# Patient Record
Sex: Male | Born: 1965
Health system: Southern US, Community
[De-identification: ages and names within clinical notes are randomized; demographics above are authoritative.]

## PROBLEM LIST (undated history)

## (undated) DIAGNOSIS — I639 Cerebral infarction, unspecified: Secondary | ICD-10-CM

## (undated) DIAGNOSIS — R569 Unspecified convulsions: Secondary | ICD-10-CM

## (undated) DIAGNOSIS — E119 Type 2 diabetes mellitus without complications: Secondary | ICD-10-CM

## (undated) HISTORY — PX: CORONARY ARTERY BYPASS GRAFT: SHX141

---

## 2020-04-23 ENCOUNTER — Emergency Department (HOSPITAL_COMMUNITY): Payer: Self-pay

## 2020-04-23 ENCOUNTER — Emergency Department (HOSPITAL_COMMUNITY)
Admission: EM | Admit: 2020-04-23 | Discharge: 2020-04-24 | Discharge status: Against Medical Advice | Payer: Self-pay | Attending: Emergency Medicine | Admitting: Emergency Medicine

## 2020-04-23 DIAGNOSIS — R202 Paresthesia of skin: Secondary | ICD-10-CM | POA: Insufficient documentation

## 2020-04-23 DIAGNOSIS — I639 Cerebral infarction, unspecified: Secondary | ICD-10-CM | POA: Insufficient documentation

## 2020-04-23 DIAGNOSIS — R531 Weakness: Secondary | ICD-10-CM

## 2020-04-23 DIAGNOSIS — R2990 Unspecified symptoms and signs involving the nervous system: Secondary | ICD-10-CM

## 2020-04-23 DIAGNOSIS — E119 Type 2 diabetes mellitus without complications: Secondary | ICD-10-CM | POA: Insufficient documentation

## 2020-04-23 LAB — COMPREHENSIVE METABOLIC PANEL
ALT: 12 U/L (ref 0–44)
AST: 16 U/L (ref 15–41)
Albumin: 3.6 g/dL (ref 3.5–5.0)
Alkaline Phosphatase: 72 U/L (ref 38–126)
Anion gap: 11 (ref 5–15)
BUN: 18 mg/dL (ref 6–20)
CO2: 20 mmol/L — ABNORMAL LOW (ref 22–32)
Calcium: 9.2 mg/dL (ref 8.9–10.3)
Chloride: 109 mmol/L (ref 98–111)
Creatinine, Ser: 0.99 mg/dL (ref 0.61–1.24)
GFR calc Af Amer: >60 mL/min (ref >60)
GFR calc non Af Amer: >60 mL/min (ref >60)
Glucose, Bld: 98 mg/dL (ref 70–99)
Potassium: 3.8 mmol/L (ref 3.5–5.1)
Sodium: 140 mmol/L (ref 135–145)
Total Bilirubin: 1.3 mg/dL — ABNORMAL HIGH (ref 0.3–1.2)
Total Protein: 6.8 g/dL (ref 6.5–8.1)

## 2020-04-23 LAB — DIFFERENTIAL
Abs Immature Granulocytes: 0.02 10*3/uL (ref 0.00–0.07)
Basophils Absolute: 0.1 10*3/uL (ref 0.0–0.1)
Basophils Relative: 1 %
Eosinophils Absolute: 0.1 10*3/uL (ref 0.0–0.5)
Eosinophils Relative: 1 %
Immature Granulocytes: 0 %
Lymphocytes Relative: 29 %
Lymphs Abs: 2.7 10*3/uL (ref 0.7–4.0)
Monocytes Absolute: 0.8 10*3/uL (ref 0.1–1.0)
Monocytes Relative: 9 %
Neutro Abs: 5.5 10*3/uL (ref 1.7–7.7)
Neutrophils Relative %: 60 %

## 2020-04-23 LAB — CBC
HCT: 43.7 % (ref 39.0–52.0)
Hemoglobin: 14.5 g/dL (ref 13.0–17.0)
MCH: 30.1 pg (ref 26.0–34.0)
MCHC: 33.2 g/dL (ref 30.0–36.0)
MCV: 90.9 fL (ref 80.0–100.0)
Platelets: 238 10*3/uL (ref 150–400)
RBC: 4.81 MIL/uL (ref 4.22–5.81)
RDW: 13.8 % (ref 11.5–15.5)
WBC: 9.3 10*3/uL (ref 4.0–10.5)
nRBC: 0 % (ref 0.0–0.2)

## 2020-04-23 LAB — I-STAT CHEM 8, ED
BUN: 23 mg/dL — ABNORMAL HIGH (ref 6–20)
Calcium, Ion: 1.17 mmol/L (ref 1.15–1.40)
Chloride: 106 mmol/L (ref 98–111)
Creatinine, Ser: 1 mg/dL (ref 0.61–1.24)
Glucose, Bld: 98 mg/dL (ref 70–99)
HCT: 44 % (ref 39.0–52.0)
Hemoglobin: 15 g/dL (ref 13.0–17.0)
Potassium: 3.7 mmol/L (ref 3.5–5.1)
Sodium: 142 mmol/L (ref 135–145)
TCO2: 19 mmol/L — ABNORMAL LOW (ref 22–32)

## 2020-04-23 LAB — APTT: aPTT: 28 seconds (ref 24–36)

## 2020-04-23 LAB — PROTIME-INR
INR: 1 (ref 0.8–1.2)
Prothrombin Time: 13.2 seconds (ref 11.4–15.2)

## 2020-04-23 LAB — CBG MONITORING, ED: Glucose-Capillary: 82 mg/dL (ref 70–99)

## 2020-04-23 LAB — ETHANOL: Alcohol, Ethyl (B): <10 mg/dL (ref <10)

## 2020-04-23 MED ORDER — LORAZEPAM 1 MG PO TABS
1.0000 mg | ORAL_TABLET | Freq: Once | ORAL | Status: AC
  Administered 2020-04-24: 1 mg via ORAL
  Filled 2020-04-23: qty 1

## 2020-04-23 NOTE — ED Provider Notes (Signed)
[facility] EMERGENCY DEPARTMENT Provider Note   CSN: [id number] Arrival date & time: 04/23/20  2209     History No chief complaint on file.   Artis Beggs is a 54 y.o. male.  Patient brought in by EMS as a code stroke.  Patient last normal sometimes last evening.  Has left facial weakness left upper extremity left lower extremity weakness.  Patient being seen by code stroke team as well as me.  Patient states he moved here 4 days ago.  Has a [place] driver's license.  EMS was called for him having problems.  Patient states that he has had history of multiple strokes in the past this is happened many times.  Past medical history seems to be significant for diabetes.        No past medical history on file.  There are no problems to display for this patient.        No family history on file.  Social History   Tobacco Use  . Smoking status: Not on file  Substance Use Topics  . Alcohol use: Not on file  . Drug use: Not on file    Home Medications Prior to Admission medications   Not on File    Allergies    Patient has no allergy information on record.  Review of Systems   Review of Systems  Constitutional: Negative for chills and fever.  HENT: Negative for congestion, rhinorrhea and sore throat.   Eyes: Negative for visual disturbance.  Respiratory: Negative for cough and shortness of breath.   Cardiovascular: Negative for chest pain and leg swelling.  Gastrointestinal: Negative for abdominal pain, diarrhea, nausea and vomiting.  Genitourinary: Negative for dysuria.  Musculoskeletal: Negative for back pain and neck pain.  Skin: Negative for rash.  Neurological: Positive for weakness and numbness. Negative for dizziness, light-headedness and headaches.  Hematological: Does not bruise/bleed easily.  Psychiatric/Behavioral: Negative for confusion.    Physical Exam Updated Vital Signs BP (!) 132/77 (BP Location: Right Arm)   Pulse 78    Resp 22   SpO2 100%   Physical Exam Vitals and nursing note reviewed.  Constitutional:      Appearance: He is well-developed.  HENT:     Head: Normocephalic and atraumatic.  Eyes:     Conjunctiva/sclera: Conjunctivae normal.     Comments: Patient with difficulty opening left eye.  He rolls the left eye back.  Cardiovascular:     Rate and Rhythm: Normal rate and regular rhythm.     Heart sounds: No murmur heard.   Pulmonary:     Effort: Pulmonary effort is normal. No respiratory distress.     Breath sounds: Normal breath sounds.  Abdominal:     Palpations: Abdomen is soft.     Tenderness: There is no abdominal tenderness.  Musculoskeletal:        General: No swelling.     Cervical back: Neck supple.  Skin:    General: Skin is warm and dry.  Neurological:     Mental Status: He is alert and oriented to person, place, and time.     Cranial Nerves: Cranial nerve deficit present.     Sensory: Sensory deficit present.     Motor: Weakness present.     Comments: Patient appears to have left facial weakness.  Left upper extremity weakness.  And left lower extremity weakness.  But also he does seem to be tightening his muscles in his thigh.  Questioning whether there is true weakness  or not.     ED Results / Procedures / Treatments   Labs (all labs ordered are listed, but only abnormal results are displayed) Labs Reviewed  ETHANOL  PROTIME-INR  APTT  CBC  DIFFERENTIAL  COMPREHENSIVE METABOLIC PANEL  RAPID URINE DRUG SCREEN, HOSP PERFORMED  URINALYSIS, ROUTINE W REFLEX MICROSCOPIC  I-STAT CHEM 8, ED    EKG None  Radiology No results found.  Procedures Procedures (including critical care time)  CRITICAL CARE Performed by: Vanetta Mulders Total critical care time: 45 minutes Critical care time was exclusive of separately billable procedures and treating other patients. Critical care was necessary to treat or prevent imminent or life-threatening  deterioration. Critical care was time spent personally by me on the following activities: development of treatment plan with patient and/or surrogate as well as nursing, discussions with consultants, evaluation of patient's response to treatment, examination of patient, obtaining history from patient or surrogate, ordering and performing treatments and interventions, ordering and review of laboratory studies, ordering and review of radiographic studies, pulse oximetry and re-evaluation of patient's condition.   Medications Ordered in ED Medications - No data to display  ED Course  I have reviewed the triage vital signs and the nursing notes.  Pertinent labs & imaging results that were available during my care of the patient were reviewed by me and considered in my medical decision making (see chart for details).    MDM Rules/Calculators/A&P                          Patient taken immediately to CT scanner for the head.  The left-sided weakness is questionable whether it is true or not or somewhat dependent on his effort.   Code stroke order set done.  Both my self and to the neuro hospitalist feel that this may be psychogenic in nature.  Are psychiatric in nature.  If head CT shows nothing acute plan is MRI if that is negative can be discharged home.  Chart review shows that there have been psychiatric admissions in the past.    Final Clinical Impression(s) / ED Diagnoses Final diagnoses:  Cerebrovascular accident (CVA), unspecified mechanism [facility])    Rx / DC Orders ED Discharge Orders    None       Vanetta Mulders, MD 04/27/20 1554

## 2020-04-23 NOTE — ED Triage Notes (Signed)
Pt came in [facility] c/o of Code Stoke by EMS. Pt was at his friends house when they noticed pt was having Left sided Facial Droop, sensations deficits, and change in speech./ EMS reports LSW at 2030.

## 2020-04-23 NOTE — Consult Note (Addendum)
Neurology Consultation  Reason for Consult: Code stroke for left-sided weakness Referring Physician: Dr. Nanda Quinton, EDP  CC: Left-sided weakness  History is obtained from: Patient, outside chart review from [facility]  HPI: Jeremy Lang is a 54 y.o. male past medical history diabetes, hypertension, seizure disorder-supposed to be on Keppra and Vimpat-unclear if he is compliant, schizoaffective disorder, bipolar disorder, atrial fibrillation not on anticoagulation-Xarelto has been prescribed which she is not taking, reportedly under a lot of stress with his living situation in [place] due to his sister not letting him catch his so security checks and controlling his finances, visiting friends here in [place], with last known normal sometime last night-unclear time, with symptoms of left-sided weakness noted since waking up this morning with worsening of the symptoms this evening brought in by EMS as concern for code stroke. He reports that he traveled from [place] to see his friends. He was not feeling good yesterday. He went to bed last night after having had multiple episodes of diarrhea. When he woke up this morning he was feeling some numbness on the left side and weakness as well but he did not want to alarm his friends so he did not make much of it. Sometime this evening, his symptoms became worse and EMS was called. They evaluated him and found the symptoms consistent with a stroke and brought him as an acute code stroke. On my examination, multiple inconsistencies in exam leading me to believe that there is a probable nonneurological etiology of his symptoms. Noncontrast head CT unremarkable for acute change. Chronic left basal ganglia infarct.  Reports extensive stressors due to living situation and financial conflicts with family. Reports blurred vision ongoing which has been told is because of diabetes complications. Also has an extensive psychiatric history but believes that  all that is because his prior psychiatrists and doctors evaluated him did not like him and hated him for some reason.    LKW: Sometime yesterday-at least 24 hours from presentation tpa given?: no, outside the window Premorbid modified Rankin scale (mRS): 0   ROS: Performed and negative except as noted in HPI  Past medical history: As above  No family history on file. Mother: Diabetes  Social History:   has no history on file for tobacco use, alcohol use, and drug use. Current smoker. Denies illicit drug use Medications No current facility-administered medications for this encounter. No current outpatient medications on file.   Exam: Current vital signs: BP (!) 132/78   Pulse 76   Resp 22   SpO2 100%  Vital signs in last 24 hours: Pulse Rate:  [76-78] 76 (07/27 2232) Resp:  [22] 22 (07/27 2232) BP: (132)/(77-78) 132/78 (07/27 2232) SpO2:  [100 %] 100 % (07/27 2232) General: Awake alert in no distress HEENT: Normocephalic atraumatic CVs: Regular rate rhythm Respiratory: Breathing well saturating normally on room air Extremities warm well perfused Neurological exam Awake alert oriented x3 Speech-no dysarthria but has a stutter which she says is baseline. No evidence of aphasia Cranial nerve examination: Keeps his left eye forcefully closed and later on during conversation opened it, pupils equal round react light, extraocular movements intact, visual fields full, had obvious volitional closure of the left angle of the mouth which she reported as getting better as well and during conversation in the next 15 to 20 minutes completely resolved, tongue midline. Motor exam: Positive the effort dependent left arm and leg weakness-positive Hoover sign. Full strength on the right 5/5. Sensory exam: Complete loss  of sensation on the left side with sharp cut off in the midline. Intact on the right side Coordination: No dysmetria NIH stroke scale 9   Labs I have reviewed labs in  epic and the results pertinent to this consultation are:  CBC    Component Value Date/Time   WBC 9.3 04/23/2020 2219   RBC 4.81 04/23/2020 2219   HGB 14.5 04/23/2020 2219   HCT 43.7 04/23/2020 2219   PLT 238 04/23/2020 2219   MCV 90.9 04/23/2020 2219   MCH 30.1 04/23/2020 2219   MCHC 33.2 04/23/2020 2219   RDW 13.8 04/23/2020 2219   LYMPHSABS 2.7 04/23/2020 2219   MONOABS 0.8 04/23/2020 2219   EOSABS 0.1 04/23/2020 2219   BASOSABS 0.1 04/23/2020 2219    CMP  No results found for: NA, K, CL, CO2, GLUCOSE, BUN, CREATININE, CALCIUM, PROT, ALBUMIN, AST, ALT, ALKPHOS, BILITOT, GFRNONAA, GFRAA  Imaging I have reviewed the images obtained: CT head: No acute changes. Question of a comm aneurysm on noncontrasted CT. Follow-up with vessel imaging recommended.   Assessment: 54 year old man with diabetes, hypertension, seizure disorder noncompliant to medications, atrial fibrillation noncompliant anticoagulation, schizoaffective disorder, bipolar disorder, presenting with sudden onset of left-sided weakness which had been ongoing since waking up this morning. Last known normal sometime last night-she is very unclear about that history. He has an extensive psychiatric history. His examination had a lot of inconsistencies with effort dependent weakness and sensory deficits that were consistent with a nonneurological etiology. Noncontrast head CT negative for bleed or acute process but did question a anterior communicating artery aneurysm-follow-up with vessel imaging recommended. At this point, I do not think his symptoms are consistent with an acute stroke but are rather consistent with a psychogenic etiology. I would recommend doing an MRI of the brain given his extensive cerebrovascular risk factors as well as an MRA of the head to evaluate the question of the aneurysm in the anterior communicating artery.  Impression: Evaluate for stroke versus psychogenic left-sided  weakness  Recommendations: MRI brain without contrast MRA head without contrast If both are unremarkable, can be discharged home with outpatient follow-up with his primary care as well as a outpatient psychiatry consultation. In the absence of a positive imaging study, no further neurological recommendations Discussed this in detail with the patient as well as ED provider Dr. Gustavus Messing. -- Milon Dikes, MD [facility] Pager: [phone] If 7pm to 7am, please call on call as listed on AMION.   ADDENDUM  MRI brain and MRA head completed and reviewed. Formal radiology read pasted below:  IMPRESSION: MRI HEAD IMPRESSION:  1. No acute intracranial infarct or other abnormality. 2. Chronic hemorrhagic lacunar infarct involving the left basal ganglia/corona radiata. Additional small focus of encephalomalacia at the anterior left temporal lobe could be related to prior infarct or remote trauma. 3. Underlying mild age-related cerebral atrophy with chronic small vessel ischemic disease.  MRA HEAD IMPRESSION:  1. Negative intracranial MRA for large vessel occlusion. 2. Mild intracranial atherosclerotic disease without hemodynamically significant or correctable stenosis. 3. 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 4. Fetal type right PCA.   Patient was walking the hallways and asked for some food at around 0250 hrs.  I gave him a sandwich and ginger ale from the nourishment room.  Later on per notes, patient became agitated with the staff, IVs were removed and he eloped from the emergency room without signing any paperwork.  I have no new set of recommendations at this time based on the imaging.  Please call me if I can be of any further assistance  -- Milon Dikes, MD [facility] Pager: [phone] If 7pm to 7am, please call on call as listed on  AMION.

## 2020-04-24 NOTE — ED Notes (Signed)
MD educated pt on risk and benefits of the MRI. Pt agreed to get the scan

## 2020-04-24 NOTE — ED Notes (Signed)
Patient cussing at staff in hall. IV removed by RN and seen eloping from ED.

## 2020-04-24 NOTE — ED Notes (Signed)
Pt is requesting to NOT have thew scan and to be discharged.

## 2020-04-24 NOTE — ED Provider Notes (Signed)
Patient signed out pending MRI.  Initially did not tolerate MRI secondary to claustrophobia.  Ativan ordered.  Patient ultimately question need for MRI.  Had a long discussion with the patient regarding utility of MRI and her stratification for further strokes.  He finally consented.  3:20 AM While pending results of MRI, patient became abusive towards staff and was cussing per charge nurse.  He eloped from the ED.  I did review his MRI which appears to have mostly chronic results.  Neurology was updated who has reviewed the MRI as well.  I was unable to give patient results of his testing although do not feel he needs admission.  Results for orders placed or performed during the hospital encounter of 04/23/20  Ethanol  Result Value Ref Range   Alcohol, Ethyl (B) <10 <10 mg/dL  CBC  Result Value Ref Range   WBC 9.3 4.0 - 10.5 K/uL   RBC 4.81 4.22 - 5.81 MIL/uL   Hemoglobin 14.5 13.0 - 17.0 g/dL   HCT 27.7 39 - 52 %   MCV 90.9 80.0 - 100.0 fL   MCH 30.1 26.0 - 34.0 pg   MCHC 33.2 30.0 - 36.0 g/dL   RDW 41.2 87.8 - 67.6 %   Platelets 238 150 - 400 K/uL   nRBC 0.0 0.0 - 0.2 %  Differential  Result Value Ref Range   Neutrophils Relative % 60 %   Neutro Abs 5.5 1.7 - 7.7 K/uL   Lymphocytes Relative 29 %   Lymphs Abs 2.7 0.7 - 4.0 K/uL   Monocytes Relative 9 %   Monocytes Absolute 0.8 0 - 1 K/uL   Eosinophils Relative 1 %   Eosinophils Absolute 0.1 0 - 0 K/uL   Basophils Relative 1 %   Basophils Absolute 0.1 0 - 0 K/uL   Immature Granulocytes 0 %   Abs Immature Granulocytes 0.02 0.00 - 0.07 K/uL  Comprehensive metabolic panel  Result Value Ref Range   Sodium 140 135 - 145 mmol/L   Potassium 3.8 3.5 - 5.1 mmol/L   Chloride 109 98 - 111 mmol/L   CO2 20 (L) 22 - 32 mmol/L   Glucose, Bld 98 70 - 99 mg/dL   BUN 18 6 - 20 mg/dL   Creatinine, Ser 7.20 0.61 - 1.24 mg/dL   Calcium 9.2 8.9 - 94.7 mg/dL   Total Protein 6.8 6.5 - 8.1 g/dL   Albumin 3.6 3.5 - 5.0 g/dL   AST 16 15 - 41  U/L   ALT 12 0 - 44 U/L   Alkaline Phosphatase 72 38 - 126 U/L   Total Bilirubin 1.3 (H) 0.3 - 1.2 mg/dL   GFR calc non Af Amer >60 >60 mL/min   GFR calc Af Amer >60 >60 mL/min   Anion gap 11 5 - 15  Protime-INR  Result Value Ref Range   Prothrombin Time 13.2 11.4 - 15.2 seconds   INR 1.0 0.8 - 1.2  APTT  Result Value Ref Range   aPTT 28 24 - 36 seconds  I-stat chem 8, ED  Result Value Ref Range   Sodium 142 135 - 145 mmol/L   Potassium 3.7 3.5 - 5.1 mmol/L   Chloride 106 98 - 111 mmol/L   BUN 23 (H) 6 - 20 mg/dL   Creatinine, Ser 0.96 0.61 - 1.24 mg/dL   Glucose, Bld 98 70 - 99 mg/dL   Calcium, Ion 2.83 6.62 - 1.40 mmol/L   TCO2 19 (L) 22 - 32 mmol/L  Hemoglobin 15.0 13.0 - 17.0 g/dL   HCT 16.144.0 39 - 52 %  CBG monitoring, ED  Result Value Ref Range   Glucose-Capillary 82 70 - 99 mg/dL   MR ANGIO HEAD WO CONTRAST  Result Date: 04/24/2020 CLINICAL DATA:  Initial evaluation for acute stroke, left-sided facial droop. EXAM: MRI HEAD WITHOUT CONTRAST MRA HEAD WITHOUT CONTRAST TECHNIQUE: Multiplanar, multiecho pulse sequences of the brain and surrounding structures were obtained without intravenous contrast. Angiographic images of the head were obtained using MRA technique without contrast. COMPARISON:  Prior head CT from 04/23/2020. FINDINGS: MRI HEAD FINDINGS Brain: Generalized age-related cerebral atrophy. Mild scattered patchy T2/FLAIR hyperintensity within the periventricular deep white matter both cerebral hemispheres most consistent with chronic small vessel ischemic disease, mild in nature. Superimposed remote lacunar infarct present at the left basal ganglia/corona radiata. Associated chronic hemosiderin staining. Additional small focus of encephalomalacia at the anterior left temporal lobe could be related to remote ischemia or possibly previous trauma (series 19, image 10). No abnormal foci of restricted diffusion to suggest acute or subacute ischemia. Gray-white matter  differentiation otherwise maintained. No other areas of encephalomalacia to suggest chronic cortical infarction. No other evidence for acute or chronic intracranial hemorrhage. No mass lesion, midline shift or mass effect. No hydrocephalus or extra-axial fluid collection. Pituitary gland suprasellar region normal. Midline structures intact. Vascular: Major intracranial vascular flow voids are maintained. Hypoplastic left vertebral artery noted. Skull and upper cervical spine: Craniocervical junction within normal limits. Bone marrow signal intensity normal. No scalp soft tissue abnormality. Sinuses/Orbits: Globes and orbital soft tissues within normal limits. Mild mucosal thickening noted within the left greater than right maxillary sinuses. Paranasal sinuses are otherwise largely clear. Small left mastoid effusion noted, of doubtful significance. Inner ear structures grossly normal. Other: None. MRA HEAD FINDINGS ANTERIOR CIRCULATION: Visualized distal cervical segments of the internal carotid arteries are patent with symmetric antegrade flow. Petrous, cavernous, and supraclinoid ICAs patent without stenosis or other abnormality. Left A1 widely patent. Right A1 hypoplastic and/or absent, accounting for the slightly diminutive right ICA is compared to the left. Mild irregularity about the anterior communicating artery complex favored to be secondary to motion artifact. Anterior cerebral arteries patent to their distal aspects without stenosis. Mild atheromatous irregularity within the M1 segments without stenosis. Normal MCA bifurcations. Distal MCA branches well perfused and symmetric. Distal small vessel atheromatous irregularity. POSTERIOR CIRCULATION: Dominant right vertebral artery widely patent to the vertebrobasilar junction. Patent right PICA. Left vertebral artery hypoplastic and terminates in PICA. Left PICA patent as well. Basilar patent to its distal aspect without stenosis. Superior cerebral arteries  patent bilaterally. Left PCA supplied via the basilar. Fetal type origin of the right PCA. Mild atheromatous irregularity within the P2 segments bilaterally without flow-limiting stenosis. Both PCAs widely patent to their distal aspects. No intracranial aneurysm. IMPRESSION: MRI HEAD IMPRESSION: 1. No acute intracranial infarct or other abnormality. 2. Chronic hemorrhagic lacunar infarct involving the left basal ganglia/corona radiata. Additional small focus of encephalomalacia at the anterior left temporal lobe could be related to prior infarct or remote trauma. 3. Underlying mild age-related cerebral atrophy with chronic small vessel ischemic disease. MRA HEAD IMPRESSION: 1. Negative intracranial MRA for large vessel occlusion. 2. Mild intracranial atherosclerotic disease without hemodynamically significant or correctable stenosis. 3. 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 4. Fetal type right PCA. Electronically Signed   By: Rise MuBenjamin  McClintock M.D.   On: 04/24/2020 02:51  MR Brain Wo Contrast (neuro protocol)  Result Date: 04/24/2020 CLINICAL DATA:  Initial evaluation for acute stroke, left-sided facial droop. EXAM: MRI HEAD WITHOUT CONTRAST MRA HEAD WITHOUT CONTRAST TECHNIQUE: Multiplanar, multiecho pulse sequences of the brain and surrounding structures were obtained without intravenous contrast. Angiographic images of the head were obtained using MRA technique without contrast. COMPARISON:  Prior head CT from 04/23/2020. FINDINGS: MRI HEAD FINDINGS Brain: Generalized age-related cerebral atrophy. Mild scattered patchy T2/FLAIR hyperintensity within the periventricular deep white matter both cerebral hemispheres most consistent with chronic small vessel ischemic disease, mild in nature. Superimposed remote lacunar infarct present at the left basal ganglia/corona radiata. Associated chronic hemosiderin  staining. Additional small focus of encephalomalacia at the anterior left temporal lobe could be related to remote ischemia or possibly previous trauma (series 19, image 10). No abnormal foci of restricted diffusion to suggest acute or subacute ischemia. Gray-white matter differentiation otherwise maintained. No other areas of encephalomalacia to suggest chronic cortical infarction. No other evidence for acute or chronic intracranial hemorrhage. No mass lesion, midline shift or mass effect. No hydrocephalus or extra-axial fluid collection. Pituitary gland suprasellar region normal. Midline structures intact. Vascular: Major intracranial vascular flow voids are maintained. Hypoplastic left vertebral artery noted. Skull and upper cervical spine: Craniocervical junction within normal limits. Bone marrow signal intensity normal. No scalp soft tissue abnormality. Sinuses/Orbits: Globes and orbital soft tissues within normal limits. Mild mucosal thickening noted within the left greater than right maxillary sinuses. Paranasal sinuses are otherwise largely clear. Small left mastoid effusion noted, of doubtful significance. Inner ear structures grossly normal. Other: None. MRA HEAD FINDINGS ANTERIOR CIRCULATION: Visualized distal cervical segments of the internal carotid arteries are patent with symmetric antegrade flow. Petrous, cavernous, and supraclinoid ICAs patent without stenosis or other abnormality. Left A1 widely patent. Right A1 hypoplastic and/or absent, accounting for the slightly diminutive right ICA is compared to the left. Mild irregularity about the anterior communicating artery complex favored to be secondary to motion artifact. Anterior cerebral arteries patent to their distal aspects without stenosis. Mild atheromatous irregularity within the M1 segments without stenosis. Normal MCA bifurcations. Distal MCA branches well perfused and symmetric. Distal small vessel atheromatous irregularity. POSTERIOR  CIRCULATION: Dominant right vertebral artery widely patent to the vertebrobasilar junction. Patent right PICA. Left vertebral artery hypoplastic and terminates in PICA. Left PICA patent as well. Basilar patent to its distal aspect without stenosis. Superior cerebral arteries patent bilaterally. Left PCA supplied via the basilar. Fetal type origin of the right PCA. Mild atheromatous irregularity within the P2 segments bilaterally without flow-limiting stenosis. Both PCAs widely patent to their distal aspects. No intracranial aneurysm. IMPRESSION: MRI HEAD IMPRESSION: 1. No acute intracranial infarct or other abnormality. 2. Chronic hemorrhagic lacunar infarct involving the left basal ganglia/corona radiata. Additional small focus of encephalomalacia at the anterior left temporal lobe could be related to prior infarct or remote trauma. 3. Underlying mild age-related cerebral atrophy with chronic small vessel ischemic disease. MRA HEAD IMPRESSION: 1. Negative intracranial MRA for large vessel occlusion. 2. Mild intracranial atherosclerotic disease without hemodynamically significant or correctable stenosis. 3. 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 4. Fetal type right PCA. Electronically Signed   By: Rise Mu M.D.   On: 04/24/2020 02:51   CT HEAD CODE STROKE WO CONTRAST  Result Date: 04/23/2020 CLINICAL DATA:  Code stroke. 54 year old male with left side weakness. EXAM: CT HEAD WITHOUT CONTRAST TECHNIQUE: Contiguous axial images were obtained  from the base of the skull through the vertex without intravenous contrast. COMPARISON:  None. FINDINGS: Brain: Confluent hypodensity in the left corona radiata tracking to the left lentiform. No associated hemorrhage or mass effect. Elsewhere gray-white matter differentiation is within normal limits. No midline shift, ventriculomegaly, mass effect, evidence of mass  lesion, intracranial hemorrhage or evidence of cortically based acute infarction. Vascular: Dominant appearing right vertebral artery. Calcified atherosclerosis at the skull base. Conspicuous 5 mm vessel at the anterior communicating artery region (coronal image 37). No superimposed suspicious intracranial vascular hyperdensity. Skull: Negative. Sinuses/Orbits: Mucosal thickening and small fluid level in the left maxillary sinus. Other Visualized paranasal sinuses and mastoids are clear. Other: Visualized orbits and scalp soft tissues are within normal limits. ASPECTS [facility] Stroke Program Early CT Score) Total score (0-10 with 10 being normal): 10 (in the right hemisphere). IMPRESSION: 1. No acute cortically based infarct or intracranial hemorrhage identified. But there is age indeterminate small vessel ischemia of the LEFT corona radiata/lentiform. Right hemisphere ASPECTS 10. 2. Anterior communicating artery region aneurysm versus vessel tortuosity. Recommend follow-up CTA or MRA. 3. These results were communicated to Dr. Wilford Corner at 10:31 pm on 04/23/2020 by text page via the [facility] messaging system. Electronically Signed   By: Odessa Fleming M.D.   On: 04/23/2020 22:31      Janette Harvie, Mayer Masker, MD 04/24/20 [phone]

## 2020-05-11 ENCOUNTER — Encounter (HOSPITAL_COMMUNITY): Payer: Self-pay | Admitting: Emergency Medicine

## 2020-05-11 ENCOUNTER — Other Ambulatory Visit: Payer: Self-pay

## 2020-05-11 ENCOUNTER — Emergency Department (HOSPITAL_COMMUNITY): Payer: Medicaid Other

## 2020-05-11 ENCOUNTER — Emergency Department (HOSPITAL_COMMUNITY)
Admission: EM | Admit: 2020-05-11 | Discharge: 2020-05-12 | Disposition: A | Discharge status: Home / Self Care | Payer: Medicaid Other | Attending: Emergency Medicine | Admitting: Emergency Medicine

## 2020-05-11 DIAGNOSIS — E13621 Other specified diabetes mellitus with foot ulcer: Secondary | ICD-10-CM

## 2020-05-11 DIAGNOSIS — L089 Local infection of the skin and subcutaneous tissue, unspecified: Secondary | ICD-10-CM | POA: Diagnosis present

## 2020-05-11 DIAGNOSIS — Z7982 Long term (current) use of aspirin: Secondary | ICD-10-CM | POA: Diagnosis not present

## 2020-05-11 DIAGNOSIS — L97518 Non-pressure chronic ulcer of other part of right foot with other specified severity: Secondary | ICD-10-CM | POA: Diagnosis not present

## 2020-05-11 DIAGNOSIS — E11621 Type 2 diabetes mellitus with foot ulcer: Secondary | ICD-10-CM | POA: Insufficient documentation

## 2020-05-11 DIAGNOSIS — Z794 Long term (current) use of insulin: Secondary | ICD-10-CM | POA: Diagnosis not present

## 2020-05-11 HISTORY — DX: Unspecified convulsions: R56.9

## 2020-05-11 HISTORY — DX: Type 2 diabetes mellitus without complications: E11.9

## 2020-05-11 HISTORY — DX: Cerebral infarction, unspecified: I63.9

## 2020-05-11 LAB — COMPREHENSIVE METABOLIC PANEL
ALT: 11 U/L (ref 0–44)
AST: 14 U/L — ABNORMAL LOW (ref 15–41)
Albumin: 3.6 g/dL (ref 3.5–5.0)
Alkaline Phosphatase: 79 U/L (ref 38–126)
Anion gap: 11 (ref 5–15)
BUN: 18 mg/dL (ref 6–20)
CO2: 23 mmol/L (ref 22–32)
Calcium: 9.4 mg/dL (ref 8.9–10.3)
Chloride: 102 mmol/L (ref 98–111)
Creatinine, Ser: 1.19 mg/dL (ref 0.61–1.24)
GFR calc Af Amer: >60 mL/min (ref >60)
GFR calc non Af Amer: >60 mL/min (ref >60)
Glucose, Bld: 143 mg/dL — ABNORMAL HIGH (ref 70–99)
Potassium: 4.3 mmol/L (ref 3.5–5.1)
Sodium: 136 mmol/L (ref 135–145)
Total Bilirubin: 1.4 mg/dL — ABNORMAL HIGH (ref 0.3–1.2)
Total Protein: 7.2 g/dL (ref 6.5–8.1)

## 2020-05-11 LAB — CBC WITH DIFFERENTIAL/PLATELET
Abs Immature Granulocytes: 0.02 10*3/uL (ref 0.00–0.07)
Basophils Absolute: 0.1 10*3/uL (ref 0.0–0.1)
Basophils Relative: 1 %
Eosinophils Absolute: 0.1 10*3/uL (ref 0.0–0.5)
Eosinophils Relative: 1 %
HCT: 46.4 % (ref 39.0–52.0)
Hemoglobin: 15.2 g/dL (ref 13.0–17.0)
Immature Granulocytes: 0 %
Lymphocytes Relative: 24 %
Lymphs Abs: 2 10*3/uL (ref 0.7–4.0)
MCH: 29.3 pg (ref 26.0–34.0)
MCHC: 32.8 g/dL (ref 30.0–36.0)
MCV: 89.4 fL (ref 80.0–100.0)
Monocytes Absolute: 0.7 10*3/uL (ref 0.1–1.0)
Monocytes Relative: 8 %
Neutro Abs: 5.6 10*3/uL (ref 1.7–7.7)
Neutrophils Relative %: 66 %
Platelets: 253 10*3/uL (ref 150–400)
RBC: 5.19 MIL/uL (ref 4.22–5.81)
RDW: 13.3 % (ref 11.5–15.5)
WBC: 8.5 10*3/uL (ref 4.0–10.5)
nRBC: 0 % (ref 0.0–0.2)

## 2020-05-11 LAB — LACTIC ACID, PLASMA: Lactic Acid, Venous: 2.4 mmol/L (ref 0.5–1.9)

## 2020-05-11 LAB — CBG MONITORING, ED: Glucose-Capillary: 112 mg/dL — ABNORMAL HIGH (ref 70–99)

## 2020-05-11 MED ORDER — IBUPROFEN 400 MG PO TABS
400.0000 mg | ORAL_TABLET | Freq: Once | ORAL | Status: AC | PRN
  Administered 2020-05-11: 400 mg via ORAL
  Filled 2020-05-11: qty 1

## 2020-05-11 NOTE — ED Triage Notes (Signed)
Pt reports wound to R great toe x 4 months.  Denies fever and chills.

## 2020-05-12 MED ORDER — NOVOLOG FLEXPEN 100 UNIT/ML ~~LOC~~ SOPN: 22.0000 [IU] | PEN_INJECTOR | Freq: Four times a day (QID) | SUBCUTANEOUS | 0 refills | Status: DC

## 2020-05-12 MED ORDER — DOXYCYCLINE HYCLATE 100 MG PO CAPS
100.0000 mg | ORAL_CAPSULE | Freq: Two times a day (BID) | ORAL | 0 refills | Status: AC
Start: 2020-05-12 — End: 2020-05-26

## 2020-05-12 MED ORDER — LEVEMIR FLEXTOUCH 100 UNIT/ML ~~LOC~~ SOPN: 20.0000 [IU] | PEN_INJECTOR | Freq: Two times a day (BID) | SUBCUTANEOUS | 0 refills | Status: AC

## 2020-05-12 MED ORDER — OXYCODONE-ACETAMINOPHEN 5-325 MG PO TABS
1.0000 | ORAL_TABLET | ORAL | Status: DC | PRN
  Administered 2020-05-12: 1 via ORAL
  Filled 2020-05-12: qty 1

## 2020-05-12 MED ORDER — CEPHALEXIN 250 MG PO CAPS
500.0000 mg | ORAL_CAPSULE | Freq: Once | ORAL | Status: AC
  Administered 2020-05-12: 500 mg via ORAL
  Filled 2020-05-12: qty 2

## 2020-05-12 MED ORDER — CEPHALEXIN 500 MG PO CAPS
500.0000 mg | ORAL_CAPSULE | Freq: Four times a day (QID) | ORAL | 0 refills | Status: AC
Start: 2020-05-12 — End: 2020-05-26

## 2020-05-12 MED ORDER — DOXYCYCLINE HYCLATE 100 MG PO TABS
100.0000 mg | ORAL_TABLET | Freq: Once | ORAL | Status: AC
  Administered 2020-05-12: 100 mg via ORAL
  Filled 2020-05-12: qty 1

## 2020-05-12 NOTE — ED Provider Notes (Addendum)
[facility] Provider Note   CSN: [id number] Arrival date & time: 05/11/20  1616     History Chief Complaint  Patient presents with  . toe infection    [facility] is a 54 y.o. male.  The history is provided by the patient and medical records. No language interpreter was used.   [facility] is a 54 y.o. male who presents to the Emergency Department complaining of toe infection. He presents the emergency department complaining of four months of wound to his right great toe. He states that initially the wound started with cracked skin that was bleeding. He has experienced progressive swelling and pain. Pain is located in the toe and radiates up his leg. He states that the swelling and pain have been there for several months but symptoms are worse today and he presents for evaluation. He denies any fevers. He does have occasional vomiting, diarrhea, constipation. He is not been on antibiotics and over one month. He has a history of diabetes, seizure, CVA. He moved to [place] five weeks ago to stay with friends due to a poor living situation back in [place]. He does not have local healthcare established.    Past Medical History:  Diagnosis Date  . Diabetes mellitus without complication (HCC)   . Seizures (HCC)   . Stroke [facility])     There are no problems to display for this patient.   History reviewed. No pertinent surgical history.     No family history on file.  Social History   Tobacco Use  . Smoking status: Never Smoker  . Smokeless tobacco: Never Used  Substance Use Topics  . Alcohol use: Not Currently  . Drug use: Not Currently    Home Medications Prior to Admission medications   Medication Sig Start Date End Date Taking? Authorizing Provider  aspirin 81 MG EC tablet Take 81 mg by mouth daily. 02/20/20   [provider]  atorvastatin (LIPITOR) 80 MG tablet Take 80 mg by mouth daily. 02/20/20   [provider]  buPROPion (WELLBUTRIN SR) 150 MG 12 hr tablet Take 150 mg by mouth 2 (two) times daily. 02/20/20   [provider]  clopidogrel (PLAVIX) 75 MG tablet Take 75 mg by mouth daily. 02/20/20   [provider]  LEVEMIR FLEXTOUCH 100 UNIT/ML FlexPen Inject 20 Units into the skin in the morning and at bedtime. 02/20/20   [provider]  losartan (COZAAR) 100 MG tablet Take 100 mg by mouth daily. 02/20/20   [provider]  NOVOLOG FLEXPEN 100 UNIT/ML FlexPen Inject 18 Units into the skin in the morning, at noon, in the evening, and at bedtime. 02/20/20   [provider]  polyethylene glycol (MIRALAX / GLYCOLAX) 17 g packet Take 17 g by mouth daily.    [provider]    Allergies    Patient has no known allergies.  Review of Systems   Review of Systems  All other systems reviewed and are negative.   Physical Exam Updated Vital Signs BP 127/83 (BP Location: Right Arm)   Pulse 64   Temp 98.1 F (36.7 C) (Oral)   Resp 18   SpO2 100%   Physical Exam Vitals and nursing note reviewed.  Constitutional:      Appearance: He is well-developed.  HENT:     Head: Normocephalic and atraumatic.  Cardiovascular:     Rate and Rhythm: Normal rate and regular rhythm.  Pulmonary:     Effort:  Pulmonary effort is normal. No respiratory distress.  Abdominal:     Palpations: Abdomen is soft.     Tenderness: There is no abdominal tenderness. There is no guarding or rebound.  Musculoskeletal:        General: No tenderness.     Comments: 2+ DP pulses bilaterally. There is 2+ edema to the right foot. Right great toe is erythematous with brisk cap refill. There is an ulceration to the pad of the right great toe with clean base. There is a second ulceration to the lateral aspect of the fifth metatarsal with clean base. No exudate.  Skin:    General: Skin is warm and dry.  Neurological:     Mental Status: He is alert and oriented to person,  place, and time.  Psychiatric:     Comments: Flat affect         ED Results / Procedures / Treatments   Labs (all labs ordered are listed, but only abnormal results are displayed) Labs Reviewed  LACTIC ACID, PLASMA - Abnormal; Notable for the following components:      Result Value   Lactic Acid, Venous 2.4 (*)    All other components within normal limits  COMPREHENSIVE METABOLIC PANEL - Abnormal; Notable for the following components:   Glucose, Bld 143 (*)    AST 14 (*)    Total Bilirubin 1.4 (*)    All other components within normal limits  CBG MONITORING, ED - Abnormal; Notable for the following components:   Glucose-Capillary 112 (*)    All other components within normal limits  CBC WITH DIFFERENTIAL/PLATELET  LACTIC ACID, PLASMA    EKG None  Radiology DG Foot Complete Right  Result Date: 05/11/2020 CLINICAL DATA:  Great toe wound. EXAM: RIGHT FOOT COMPLETE - 3+ VIEW COMPARISON:  None. FINDINGS: There is no evidence of acute fracture or dislocation. There is no evidence of arthropathy or other focal bone abnormality. Mild to moderate severity diffuse soft tissue swelling is seen. IMPRESSION: 1. No acute osseous abnormality. 2. Mild to moderate severity diffuse soft tissue swelling. Electronically Signed   By: Aram Candela M.D.   On: 05/11/2020 22:29    Procedures Procedures (including critical care time)  Medications Ordered in ED Medications  oxyCODONE-acetaminophen (PERCOCET/ROXICET) 5-325 MG per tablet 1 tablet (1 tablet Oral Given 05/12/20 0536)  ibuprofen (ADVIL) tablet 400 mg (400 mg Oral Given 05/11/20 2157)    ED Course  I have reviewed the triage vital signs and the nursing notes.  Pertinent labs & imaging results that were available during my care of the patient were reviewed by me and considered in my medical decision making (see chart for details).    MDM Rules/Calculators/A&P                         patient with diabetes here for evaluation  of four months of swelling and pain to the right great toe and foot. He is non-toxic appearing on evaluation. He has an ulceration to the base of the great toe as well as the lateral foot with local edema. There is a strong DP pulse.   There is no evidence of osteomyelitis, abscess on physical exam. Patient states that the edema and erythema have been present for several months. Will start antibiotics due to concern for developing infection in the setting of nonhealing diabetic foot ulcer. Lactic acid is mildly elevated, clinical picture is not c/w sepsis.  Patient has limited local resources,  case management consulted for assistance and affording medications as well as establishing PCP for prompt follow-up. Plan to DC home with outpatient resources and return precautions.  Final Clinical Impression(s) / ED Diagnoses Final diagnoses:  Diabetic ulcer of toe of right foot associated with diabetes mellitus of other type, with other ulcer severity [facility])    Rx / DC Orders ED Discharge Orders    None       Tilden Fossa, MD 05/12/20 Loralie Champagne    Tilden Fossa, MD 05/12/20 [phone]

## 2020-05-12 NOTE — Care Management (Signed)
TOC consult for PCP. Patient already referred to community health and wellness. Needs to make an appointment on Monday. Referral in patient instructions.

## 2020-05-27 ENCOUNTER — Other Ambulatory Visit: Payer: Self-pay

## 2020-05-27 ENCOUNTER — Other Ambulatory Visit: Payer: Self-pay | Admitting: Family Medicine

## 2020-05-27 ENCOUNTER — Emergency Department (HOSPITAL_COMMUNITY)
Admission: EM | Admit: 2020-05-27 | Discharge: 2020-05-27 | Disposition: A | Discharge status: Home / Self Care | Payer: Medicaid Other | Attending: Emergency Medicine | Admitting: Emergency Medicine

## 2020-05-27 ENCOUNTER — Emergency Department (HOSPITAL_COMMUNITY): Payer: Medicaid Other

## 2020-05-27 DIAGNOSIS — Y939 Activity, unspecified: Secondary | ICD-10-CM | POA: Diagnosis not present

## 2020-05-27 DIAGNOSIS — M79673 Pain in unspecified foot: Secondary | ICD-10-CM | POA: Diagnosis present

## 2020-05-27 DIAGNOSIS — S91109D Unspecified open wound of unspecified toe(s) without damage to nail, subsequent encounter: Secondary | ICD-10-CM

## 2020-05-27 DIAGNOSIS — Y9289 Other specified places as the place of occurrence of the external cause: Secondary | ICD-10-CM | POA: Diagnosis not present

## 2020-05-27 DIAGNOSIS — Y999 Unspecified external cause status: Secondary | ICD-10-CM | POA: Insufficient documentation

## 2020-05-27 DIAGNOSIS — X58XXXD Exposure to other specified factors, subsequent encounter: Secondary | ICD-10-CM | POA: Diagnosis not present

## 2020-05-27 DIAGNOSIS — R112 Nausea with vomiting, unspecified: Secondary | ICD-10-CM | POA: Diagnosis not present

## 2020-05-27 DIAGNOSIS — E119 Type 2 diabetes mellitus without complications: Secondary | ICD-10-CM | POA: Insufficient documentation

## 2020-05-27 DIAGNOSIS — S91301D Unspecified open wound, right foot, subsequent encounter: Secondary | ICD-10-CM | POA: Insufficient documentation

## 2020-05-27 DIAGNOSIS — Z794 Long term (current) use of insulin: Secondary | ICD-10-CM | POA: Diagnosis not present

## 2020-05-27 DIAGNOSIS — Z7982 Long term (current) use of aspirin: Secondary | ICD-10-CM | POA: Diagnosis not present

## 2020-05-27 DIAGNOSIS — R42 Dizziness and giddiness: Secondary | ICD-10-CM | POA: Insufficient documentation

## 2020-05-27 LAB — COMPREHENSIVE METABOLIC PANEL
ALT: 13 U/L (ref 0–44)
AST: 15 U/L (ref 15–41)
Albumin: 3.5 g/dL (ref 3.5–5.0)
Alkaline Phosphatase: 92 U/L (ref 38–126)
Anion gap: 10 (ref 5–15)
BUN: 13 mg/dL (ref 6–20)
CO2: 22 mmol/L (ref 22–32)
Calcium: 9.1 mg/dL (ref 8.9–10.3)
Chloride: 104 mmol/L (ref 98–111)
Creatinine, Ser: 0.89 mg/dL (ref 0.61–1.24)
GFR calc Af Amer: >60 mL/min (ref >60)
GFR calc non Af Amer: >60 mL/min (ref >60)
Glucose, Bld: 193 mg/dL — ABNORMAL HIGH (ref 70–99)
Potassium: 4.7 mmol/L (ref 3.5–5.1)
Sodium: 136 mmol/L (ref 135–145)
Total Bilirubin: 0.9 mg/dL (ref 0.3–1.2)
Total Protein: 7.8 g/dL (ref 6.5–8.1)

## 2020-05-27 LAB — CBC
HCT: 45.2 % (ref 39.0–52.0)
Hemoglobin: 14.7 g/dL (ref 13.0–17.0)
MCH: 28.9 pg (ref 26.0–34.0)
MCHC: 32.5 g/dL (ref 30.0–36.0)
MCV: 89 fL (ref 80.0–100.0)
Platelets: 278 10*3/uL (ref 150–400)
RBC: 5.08 MIL/uL (ref 4.22–5.81)
RDW: 13 % (ref 11.5–15.5)
WBC: 10.2 10*3/uL (ref 4.0–10.5)
nRBC: 0 % (ref 0.0–0.2)

## 2020-05-27 LAB — LIPASE, BLOOD: Lipase: 24 U/L (ref 11–51)

## 2020-05-27 MED ORDER — CLINDAMYCIN HCL 300 MG PO CAPS: 300.0000 mg | ORAL_CAPSULE | Freq: Four times a day (QID) | ORAL | 0 refills | Status: AC

## 2020-05-27 MED ORDER — NAPROXEN 500 MG PO TABS: 500.0000 mg | ORAL_TABLET | Freq: Two times a day (BID) | ORAL | 0 refills | Status: AC

## 2020-05-27 MED ORDER — CLINDAMYCIN HCL 300 MG PO CAPS: 300.0000 mg | ORAL_CAPSULE | Freq: Four times a day (QID) | ORAL | 0 refills | Status: DC

## 2020-05-27 MED ORDER — NAPROXEN 375 MG PO TABS: 375.0000 mg | ORAL_TABLET | Freq: Two times a day (BID) | ORAL | 0 refills | Status: DC

## 2020-05-27 MED FILL — CLINDAMYCIN HCL 300 MG CAPS: 300 | 7 days supply | Qty: 28 | Fill #0

## 2020-05-27 MED FILL — NAPROXEN 500 MG TABS: 500 | 7 days supply | Qty: 14 | Fill #0

## 2020-05-27 NOTE — ED Notes (Signed)
Patient states he has had problems with his right foot x 5 months, he also has multiple complaints unable to eat , states he gets sick on his stomach every time he eats.

## 2020-05-27 NOTE — Discharge Planning (Signed)
[facility] J. Lucretia Roers, RN, BSN, [place] [phone]  RNCM set up appointment with Dr Alvis Lemmings on 07/03/20 @ 2:15.  Spoke with pt at bedside and advised to please arrive 15 min early and take a picture ID and your current medications.  Pt verbalizes understanding of keeping appointment.

## 2020-05-27 NOTE — ED Notes (Signed)
Ginger Ale given to drink.  

## 2020-05-27 NOTE — Discharge Planning (Signed)
RNCM consulted by EDP [facility] for medication assistance. RNCM reviewed chart and spoke with the pt about [facility] MATCH program ($3 co pay for each Rx through [facility] program, does not include refills, 7 day expiration of MATCH letter and choice of pharmacies). Pt is eligible for [facility] MATCH program (unable to find pt listed in PROCARE per cardholder name inquiry) and has agreed to accept MATCH with co-pay override PROCARE information entered. MATCH letter completed and provided to pt.  RNCM updated EDP and ED RN.

## 2020-05-27 NOTE — ED Provider Notes (Signed)
[facility] EMERGENCY DEPARTMENT Provider Note   CSN: [id number] Arrival date & time: 05/27/20  1110     History Chief Complaint  Patient presents with  . Foot Pain    Jeremy Lang is a 54 y.o. male.  HPI   54 year old male with a history of diabetes, seizures, stroke, who presents to the emergency department today for evaluation of right lower extremity wound.  Wound has been present for several months but he is not sure if it is gotten worse recently because he states that he has really bad vision.  He has not had any fevers but he has had occasional nausea and vomiting.  He also has intermittent lightheadedness.   Past Medical History:  Diagnosis Date  . Diabetes mellitus without complication (HCC)   . Seizures (HCC)   . Stroke [facility])     There are no problems to display for this patient.   No past surgical history on file.     No family history on file.  Social History   Tobacco Use  . Smoking status: Never Smoker  . Smokeless tobacco: Never Used  Substance Use Topics  . Alcohol use: Not Currently  . Drug use: Not Currently    Home Medications Prior to Admission medications   Medication Sig Start Date End Date Taking? Authorizing Provider  aspirin 81 MG EC tablet Take 81 mg by mouth daily. 02/20/20   [provider]  atorvastatin (LIPITOR) 80 MG tablet Take 80 mg by mouth daily. 02/20/20   [provider]  buPROPion (WELLBUTRIN SR) 150 MG 12 hr tablet Take 150 mg by mouth 2 (two) times daily. 02/20/20   [provider]  clindamycin (CLEOCIN) 300 MG capsule Take 1 capsule (300 mg total) by mouth every 6 (six) hours for 7 days. 05/27/20 06/03/20  Dave Mannes S, PA-C  clopidogrel (PLAVIX) 75 MG tablet Take 75 mg by mouth daily. 02/20/20   [provider]  LEVEMIR FLEXTOUCH 100 UNIT/ML FlexPen Inject 20 Units into the skin in the morning and at bedtime. 05/12/20   Albrizze, Kaitlyn E, PA-C  losartan (COZAAR)  100 MG tablet Take 100 mg by mouth daily. 02/20/20   [provider]  naproxen (NAPROSYN) 375 MG tablet Take 1 tablet (375 mg total) by mouth 2 (two) times daily for 7 days. 05/27/20 06/03/20  Sitlali Koerner S, PA-C  NOVOLOG FLEXPEN 100 UNIT/ML FlexPen Inject 22 Units into the skin in the morning, at noon, in the evening, and at bedtime. 05/12/20   Albrizze, Kaitlyn E, PA-C  polyethylene glycol (MIRALAX / GLYCOLAX) 17 g packet Take 17 g by mouth daily.    [provider]    Allergies    Patient has no known allergies.  Review of Systems   Review of Systems  Constitutional: Negative for chills and fever.  HENT: Negative for ear pain and sore throat.   Eyes: Negative for visual disturbance.  Respiratory: Negative for cough and shortness of breath.   Cardiovascular: Negative for chest pain.  Gastrointestinal: Positive for nausea and vomiting. Negative for abdominal pain, constipation and diarrhea.  Genitourinary: Negative for dysuria and hematuria.  Musculoskeletal: Positive for back pain. Negative for arthralgias.  Skin: Positive for color change and wound.  Neurological: Positive for light-headedness.  All other systems reviewed and are negative.   Physical Exam Updated Vital Signs BP (!) 150/93 (BP Location: Left Arm)   Pulse 69   Temp 98 F (36.7 C) (Axillary)   Resp 18  Wt 106.6 kg   SpO2 99%   BMI 36.81 kg/m   Physical Exam Vitals and nursing note reviewed.  Constitutional:      Appearance: He is well-developed.  HENT:     Head: Normocephalic and atraumatic.  Eyes:     Conjunctiva/sclera: Conjunctivae normal.  Cardiovascular:     Rate and Rhythm: Normal rate and regular rhythm.     Pulses: Normal pulses.     Heart sounds: Normal heart sounds. No murmur heard.   Pulmonary:     Effort: Pulmonary effort is normal. No respiratory distress.     Breath sounds: Normal breath sounds. No wheezing, rhonchi or rales.  Abdominal:     General: Bowel sounds  are normal.     Palpations: Abdomen is soft.     Tenderness: There is no abdominal tenderness. There is no guarding or rebound.  Musculoskeletal:     Cervical back: Neck supple.     Comments: Chronic appearing wound noted to the left great toe and lateral aspect of the right foot.  Wound to the right foot shows central area of necrosis.,  Right lower extremity is swollen with some mild amount of erythema to the toe.  No purulent drainage.  No significant tenderness on exam.  DP pulses intact.  Skin:    General: Skin is warm and dry.  Neurological:     Mental Status: He is alert.            ED Results / Procedures / Treatments   Labs (all labs ordered are listed, but only abnormal results are displayed) Labs Reviewed  COMPREHENSIVE METABOLIC PANEL - Abnormal; Notable for the following components:      Result Value   Glucose, Bld 193 (*)    All other components within normal limits  LIPASE, BLOOD  CBC    EKG EKG Interpretation  Date/Time:  Monday May 27 2020 13:10:59 EDT Ventricular Rate:  64 PR Interval:  242 QRS Duration: 82 QT Interval:  384 QTC Calculation: 396 R Axis:   9 Text Interpretation: Normal sinus rhythm slightly prolong PR unchanged from prior EKG Confirmed by Virgina Norfolk [phone]) on 05/27/2020 1:36:40 PM   Radiology DG Foot Complete Right  Result Date: 05/27/2020 CLINICAL DATA:  Foot ulcer great toe and fifth toe. EXAM: RIGHT FOOT COMPLETE - 3+ VIEW COMPARISON:  05/11/2020 FINDINGS: Negative for fracture or osteomyelitis. Skin ulcer below the fifth MTP. Mild degenerative change first MTP. Diffuse soft tissue swelling. Mild arterial calcification. IMPRESSION: No acute skeletal abnormality.  Soft tissue swelling. Electronically Signed   By: Marlan Palau M.D.   On: 05/27/2020 13:04    Procedures Procedures (including critical care time)  Medications Ordered in ED Medications - No data to display  ED Course  I have reviewed the triage vital  signs and the nursing notes.  Pertinent labs & imaging results that were available during my care of the patient were reviewed by me and considered in my medical decision making (see chart for details).    MDM Rules/Calculators/A&P                          54 year old male presenting for evaluation of wound to right lower extremity which has been present for the last several months.  Seen at the emergency department a few weeks ago.  Was given antibiotics at that time and is here complaining of persistent symptoms.  Reviewed records from prior visit and wounds actually appear  to be improving compared to prior visit.  His labs were repeated and did not show any evidence of a leukocytosis.  His blood glucose is a little bit elevated but he has a normal bicarb and no elevated anion gap to suggest DKA.  An x-ray was completed and does not show any changes concerning for osteomyelitis and clinically he does not appear to have this on exam.  He has had no fevers.  The remainder of his exam is benign and he has clear lungs, heart with regular rate and rhythm and abdomen is soft and nontender.  Seems the most of his complaints today are chronic and he does not appear to require admission at this time.  I did consult social work who will help him get plugged in with primary care and assist with following up for his chronic wounds.  Have advised patient to return the emergency department if he experiences any new or worsening symptoms.  He voices understanding of the plan and reasons to return.  All questions answered.  Patient stable for discharge.   Final Clinical Impression(s) / ED Diagnoses Final diagnoses:  Open wound of toe, subsequent encounter    Rx / DC Orders ED Discharge Orders         Ordered    clindamycin (CLEOCIN) 300 MG capsule  Every 6 hours,   Status:  Discontinued        05/27/20 1333    clindamycin (CLEOCIN) 300 MG capsule  Every 6 hours,   Status:  Discontinued        05/27/20 1358      clindamycin (CLEOCIN) 300 MG capsule  Every 6 hours        05/27/20 1409    naproxen (NAPROSYN) 375 MG tablet  2 times daily        05/27/20 1409           Jozalynn Noyce S, PA-C 05/27/20 1413    Virgina Norfolk, DO 05/27/20 1511

## 2020-05-27 NOTE — ED Triage Notes (Signed)
Pt reports sores on the bottom of his foot x 4-5 months and has progressed to redness in swelling in R leg. Seen for same and sts abx did not help. Has not followed up with PCP here d/t Medicaid difficulties since recently moving here from [place]. Endorses intermittent nausea.

## 2020-05-27 NOTE — Telephone Encounter (Signed)
Requested medication (s) are due for refill today: Yes  Requested medication (s) are on the active medication list: Yes  Last refill:  05/11/20  Future visit scheduled: Yes  Notes to clinic: Unable to refill, last refilled by another provider     Requested Prescriptions  Pending Prescriptions Disp Refills   NOVOLOG FLEXPEN 100 UNIT/ML FlexPen 15 mL 0    Sig: Inject 22 Units into the skin in the morning, at noon, in the evening, and at bedtime.      Endocrinology:  Diabetes - Insulins Failed - 05/27/2020  2:57 PM      Failed - HBA1C is between 0 and 7.9 and within 180 days    No results found for: HGBA1C, LABA1C        Failed - Valid encounter within last 6 months    Recent Outpatient Visits   None     Future Appointments             In 1 month Hoy Register, MD [facility]

## 2020-05-27 NOTE — ED Provider Notes (Signed)
Medical screening examination/treatment/procedure(s) were conducted as a shared visit with non-physician practitioner(s) and myself.  I personally evaluated the patient during the encounter. Briefly, the patient is a 54 y.o. male with history of diabetes, seizures, stroke who presents to the ED with reevaluation of right foot wound.  Patient with normal vitals.  No fever.  Has good pulses in his bilateral lower extremities.  Has skin ulcers to the bottom of both big toes bilaterally.  Left one looks overall well healed.  On the right he does still have some mild ulceration but compared to pictures from chart a week or so ago swelling and ulcer appear to be improving.  He is having issues with switching his Medicaid over from [place].  Overall he has no fever.  No significant leukocytosis, anemia, electrolyte abnormality.  X-ray of the right foot does not show any type of osteomyelitis.  Overall it appears that wound is improving.  We will continue another round of antibiotics orally and engage social work to help with medication assistance.  Will refer him to wound clinic/wound team.  No concern for deep space infection, osteomyelitis, sepsis at this time.  He understands return precautions.  Will put him in a postop shoe and educated him about basic wound care.  This chart was dictated using voice recognition software.  Despite best efforts to proofread,  errors can occur which can change the documentation meaning.     EKG Interpretation None           Virgina Norfolk, DO 05/27/20 1329

## 2020-05-27 NOTE — Discharge Instructions (Signed)
You were given a prescription for antibiotics. Please take the antibiotic prescription fully.   You were given several resources to follow-up with.  [facility] is a primary care clinic.  You should call to set up an appointment to get established with primary care.  You are also given information for Dr. Neita Garnet office as well as the  [facility] to follow-up in regards to your foot wound.  Please call the office to schedule an appointment for follow-up.  Please return to the emergency room immediately if you experience any new or worsening symptoms or any symptoms that indicate worsening infection such as fevers, increased redness/swelling/pain, warmth, or drainage from the affected area.

## 2020-05-27 NOTE — Telephone Encounter (Signed)
Medication Refill - Medication: NOVOLOG FLEXPEN 100 UNIT/ML FlexPen     Preferred Pharmacy (with phone number or street name):  [id number] [place], [place] - [address] AT [facility] [address] [facility] [address] Phone:  [phone]  Fax:  [phone]       Agent: Please be advised that RX refills may take up to 3 business days. We ask that you follow-up with your pharmacy.

## 2020-06-18 ENCOUNTER — Encounter (HOSPITAL_BASED_OUTPATIENT_CLINIC_OR_DEPARTMENT_OTHER): Payer: Self-pay | Admitting: Internal Medicine

## 2020-06-24 ENCOUNTER — Encounter (HOSPITAL_BASED_OUTPATIENT_CLINIC_OR_DEPARTMENT_OTHER): Payer: Medicaid Other | Attending: Internal Medicine | Admitting: Internal Medicine

## 2020-06-24 ENCOUNTER — Other Ambulatory Visit: Payer: Self-pay

## 2020-06-24 DIAGNOSIS — Z8673 Personal history of transient ischemic attack (TIA), and cerebral infarction without residual deficits: Secondary | ICD-10-CM | POA: Insufficient documentation

## 2020-06-24 DIAGNOSIS — Z794 Long term (current) use of insulin: Secondary | ICD-10-CM | POA: Insufficient documentation

## 2020-06-24 DIAGNOSIS — Z8249 Family history of ischemic heart disease and other diseases of the circulatory system: Secondary | ICD-10-CM | POA: Insufficient documentation

## 2020-06-24 DIAGNOSIS — M869 Osteomyelitis, unspecified: Secondary | ICD-10-CM | POA: Diagnosis not present

## 2020-06-24 DIAGNOSIS — E1169 Type 2 diabetes mellitus with other specified complication: Secondary | ICD-10-CM | POA: Insufficient documentation

## 2020-06-24 DIAGNOSIS — I10 Essential (primary) hypertension: Secondary | ICD-10-CM | POA: Diagnosis not present

## 2020-06-24 DIAGNOSIS — I252 Old myocardial infarction: Secondary | ICD-10-CM | POA: Insufficient documentation

## 2020-06-24 DIAGNOSIS — F17200 Nicotine dependence, unspecified, uncomplicated: Secondary | ICD-10-CM | POA: Insufficient documentation

## 2020-06-24 DIAGNOSIS — L97518 Non-pressure chronic ulcer of other part of right foot with other specified severity: Secondary | ICD-10-CM | POA: Insufficient documentation

## 2020-06-24 DIAGNOSIS — E1142 Type 2 diabetes mellitus with diabetic polyneuropathy: Secondary | ICD-10-CM | POA: Insufficient documentation

## 2020-06-24 DIAGNOSIS — Z836 Family history of other diseases of the respiratory system: Secondary | ICD-10-CM | POA: Diagnosis not present

## 2020-06-24 DIAGNOSIS — Z809 Family history of malignant neoplasm, unspecified: Secondary | ICD-10-CM | POA: Insufficient documentation

## 2020-06-24 DIAGNOSIS — E11621 Type 2 diabetes mellitus with foot ulcer: Secondary | ICD-10-CM | POA: Diagnosis not present

## 2020-06-24 DIAGNOSIS — G40909 Epilepsy, unspecified, not intractable, without status epilepticus: Secondary | ICD-10-CM | POA: Diagnosis not present

## 2020-06-24 DIAGNOSIS — F209 Schizophrenia, unspecified: Secondary | ICD-10-CM | POA: Diagnosis not present

## 2020-06-24 DIAGNOSIS — H42 Glaucoma in diseases classified elsewhere: Secondary | ICD-10-CM | POA: Diagnosis not present

## 2020-06-24 DIAGNOSIS — Z951 Presence of aortocoronary bypass graft: Secondary | ICD-10-CM | POA: Diagnosis not present

## 2020-06-24 DIAGNOSIS — Z833 Family history of diabetes mellitus: Secondary | ICD-10-CM | POA: Insufficient documentation

## 2020-06-24 DIAGNOSIS — E1139 Type 2 diabetes mellitus with other diabetic ophthalmic complication: Secondary | ICD-10-CM | POA: Insufficient documentation

## 2020-06-24 NOTE — Progress Notes (Signed)
EDAN, JUDAY ([id number]) Visit Report for 06/24/2020 Abuse/Suicide Risk Screen Details Patient Name: Date of Service: Jeremy Lang, [name] 06/24/2020 2:45 PM Medical Record Number: [id number] Patient Account Number: [id number] Date of Birth/Sex: Treating RN: 05-Dec-1965 (54 y.o. Jeremy Lang Primary Care Jeremy Lang: PA Jeremy Lang, NO Other Clinician: Referring Jeremy Lang: Treating Jeremy Lang/Extender: Jeremy Lang in Treatment: 0 Abuse/Suicide Risk Screen Items Answer ABUSE RISK SCREEN: Has anyone close to you tried to hurt or harm you recentlyo No Do you feel uncomfortable with anyone in your familyo No Has anyone forced you do things that you didnt want to doo No Electronic Signature(s) Signed: 06/24/2020 5:24:24 PM By: Jeremy Lang Entered By: Jeremy Lang on 06/24/2020 15:35:58 -------------------------------------------------------------------------------- Activities of Daily Living Details Patient Name: Date of ServiceWaynard Lang Lang, [name] 06/24/2020 2:45 PM Medical Record Number: [id number] Patient Account Number: [id number] Date of Birth/Sex: Treating RN: 07/05/1966 (54 y.o. Jeremy Lang Primary Care Jeremy Lang: PA Jeremy Lang, NO Other Clinician: Referring Karinda Cabriales: Treating Kym Lang/Extender: Jeremy Lang in Treatment: 0 Activities of Daily Living Items Answer Activities of Daily Living (Please select one for each item) Drive Automobile Not Able T Medications ake Completely Able Use T elephone Completely Able Care for Appearance Completely Able Use T oilet Completely Able Bath / Shower Completely Able Dress Self Completely Able Feed Self Completely Able Walk Need Assistance Get In / Out Bed Completely Able Housework Completely Able Prepare Meals Completely Able Handle Money Completely Able Shop for Self Completely Able Electronic Signature(s) Signed: 06/24/2020 5:24:24 PM By: Jeremy Lang Entered By: Jeremy Lang on  06/24/2020 15:36:25 -------------------------------------------------------------------------------- Education Screening Details Patient Name: Date of Service: Jeremy Booze, [name] 06/24/2020 2:45 PM Medical Record Number: [id number] Patient Account Number: [id number] Date of Birth/Sex: Treating RN: [birth year] (54 y.o. Jeremy Lang Primary Care Jeremy Lang: PA Jeremy Lang, NO Other Clinician: Referring Jeremy Lang: Treating Jeremy Lang/Extender: Jeremy Lang in Treatment: 0 Primary Learner Assessed: Patient Learning Preferences/Education Level/Primary Language Learning Preference: Explanation Preferred Language: English Cognitive Barrier Language Barrier: No Translator Needed: No Memory Deficit: No Emotional Barrier: No Cultural/Religious Beliefs Affecting Medical Care: No Physical Barrier Impaired Vision: Yes Glasses Impaired Hearing: No Decreased Hand dexterity: No Knowledge/Comprehension Knowledge Level: Medium Comprehension Level: Medium Ability to understand written instructions: Medium Ability to understand verbal instructions: Medium Motivation Anxiety Level: Calm Cooperation: Cooperative Education Importance: Acknowledges Need Interest in Health Problems: Asks Questions Perception: Coherent Willingness to Engage in Self-Management High Activities: Readiness to Engage in Self-Management High Activities: Electronic Signature(s) Signed: 06/24/2020 5:24:24 PM By: Jeremy Lang Entered By: Jeremy Lang on 06/24/2020 15:42:39 -------------------------------------------------------------------------------- Fall Risk Assessment Details Patient Name: Date of Service: Jeremy Booze, [name] 06/24/2020 2:45 PM Medical Record Number: [id number] Patient Account Number: [id number] Date of Birth/Sex: Treating RN: [birth year] (54 y.o. Jeremy Lang Primary Care Ausha Sieh: PA Jeremy Lang, NO Other Clinician: Referring Shyna Duignan: Treating Jeremy Lang/Extender: Jeremy Lang in Treatment: 0 Fall Risk Assessment Items Have you had 2 or more falls in the last 12 monthso 0 Yes Have you had any fall that resulted in injury in the last 12 monthso 0 No FALLS RISK SCREEN History of falling - immediate or within 3 months 25 Yes Secondary diagnosis (Do you have 2 or more medical diagnoseso) 15 Yes Ambulatory aid None/bed rest/wheelchair/nurse 0 No Crutches/cane/walker 15 Yes Furniture 0 No Intravenous therapy Access/Saline/Heparin Lock 0 No Gait/Transferring Normal/ bed rest/ wheelchair 0 No Weak (short steps with or without shuffle, stooped but able to lift head while walking, may seek 10  Yes support from furniture) Impaired (short steps with shuffle, may have difficulty arising from chair, head down, impaired 0 No balance) Mental Status Oriented to own ability 0 Yes Electronic Signature(s) Signed: 06/24/2020 5:24:24 PM By: Jeremy Lang Entered By: Jeremy Lang on 06/24/2020 15:44:08 -------------------------------------------------------------------------------- Foot Assessment Details Patient Name: Date of Service: Jeremy Booze, [name] 06/24/2020 2:45 PM Medical Record Number: [id number] Patient Account Number: [id number] Date of Birth/Sex: Treating RN: 1966/05/18 (54 y.o. Jeremy Lang Primary Care Jeremy Lang: PA TIENT, NO Other Clinician: Referring Jeremy Lang: Treating Jeremy Lang/Extender: Jeremy Lang in Treatment: 0 Foot Assessment Items Site Locations + = Sensation present, - = Sensation absent, C = Callus, U = Ulcer R = Redness, W = Warmth, M = Maceration, PU = Pre-ulcerative lesion F = Fissure, S = Swelling, D = Dryness Assessment Lang: Left: Other Deformity: No No Prior Foot Ulcer: No No Prior Amputation: No No Charcot Joint: No No Ambulatory Status: Ambulatory With Help Assistance Device: Walker Gait: Surveyor, mining) Signed: 06/24/2020 5:24:24 PM By: Jeremy Lang Entered By:  Jeremy Lang on 06/24/2020 15:45:54 -------------------------------------------------------------------------------- Nutrition Risk Screening Details Patient Name: Date of ServiceRodney Booze, [name] 06/24/2020 2:45 PM Medical Record Number: [id number] Patient Account Number: [id number] Date of Birth/Sex: Treating RN: [birth year] (54 y.o. Jeremy Lang Primary Care Lichelle Viets: PA Jeremy Lang, NO Other Clinician: Referring Jaylena Holloway: Treating Jaquel Glassburn/Extender: Jeremy Lang in Treatment: 0 Height (in): 67 Weight (lbs): 234 Body Mass Index (BMI): 36.6 Nutrition Risk Screening Items Score Screening NUTRITION RISK SCREEN: I have an illness or condition that made me change the kind and/or amount of food I eat 2 Yes I eat fewer than two meals per day 0 No I eat few fruits and vegetables, or milk products 0 No I have three or more drinks of beer, liquor or wine almost every day 0 No I have tooth or mouth problems that make it hard for me to eat 0 No I don't always have enough money to buy the food I need 0 No I eat alone most of the time 0 No I take three or more different prescribed or over-the-counter drugs a day 1 Yes Without wanting to, I have lost or gained 10 pounds in the last six months 0 No I am not always physically able to shop, cook and/or feed myself 0 No Nutrition Protocols Good Risk Protocol Moderate Risk Protocol 0 Provide education on nutrition High Risk Proctocol Risk Level: Moderate Risk Score: 3 Electronic Signature(s) Signed: 06/24/2020 5:24:24 PM By: Jeremy Lang Entered By: Jeremy Lang on 06/24/2020 15:44:29

## 2020-06-25 NOTE — Progress Notes (Signed)
CONNERY, SHIFFLER ([id number]) Visit Report for 06/24/2020 Chief Complaint Document Details Patient Name: Date of Service: Jeremy Lang Jeremy Lang, [place] Jeremy Lang 06/24/2020 2:45 PM Medical Record Number: [id number] Patient Account Number: [id number] Date of Birth/Sex: Treating RN: Jeremy Lang, Jeremy Lang (54 y.o. Janyth Contes Primary Care Provider: PA Haig Prophet, NO Other Clinician: Referring Provider: Treating Provider/Extender: Cheree Ditto in Treatment: 0 Information Obtained from: Patient Chief Complaint 06/24/20; patient is here for review wounds on his right foot Electronic Signature(s) Signed: 06/25/2020 7:37:15 AM By: Linton Ham MD Entered By: Linton Ham on 06/24/2020 16:57:21 -------------------------------------------------------------------------------- Debridement Details Patient Name: Date of Service: Jeremy Novak, MA Jeremy Lang 06/24/2020 2:45 PM Medical Record Number: [id number] Patient Account Number: [id number] Date of Birth/Sex: Treating RN: [birth year]y Lang (54 y.o. Janyth Contes Primary Care Provider: PA TIENT, NO Other Clinician: Referring Provider: Treating Provider/Extender: Cheree Ditto in Treatment: 0 Debridement Performed for Assessment: Wound #2 Right,Plantar Metatarsal head fifth Performed By: Physician Ricard Dillon., MD Debridement Type: Debridement Severity of Tissue Pre Debridement: Fat layer exposed Level of Consciousness (Pre-procedure): Awake and Alert Pre-procedure Verification/Time Out Yes - 16:32 Taken: Start Time: 16:32 T Area Debrided (L x W): otal 1.7 (cm) x 2.1 (cm) = 3.57 (cm) Tissue and other material debrided: Viable, Non-Viable, Slough, Subcutaneous, Slough Level: Skin/Subcutaneous Tissue Debridement Description: Excisional Instrument: Curette Bleeding: Minimum Hemostasis Achieved: Silver Nitrate End Time: 16:34 Procedural Pain: 0 Post Procedural Pain: 0 Response to Treatment: Procedure was tolerated well Level of Consciousness (Post-  Awake and Alert procedure): Post Debridement Measurements of Total Wound Length: (cm) 1.7 Width: (cm) 2.1 Depth: (cm) 0.6 Volume: (cm) 1.682 Character of Wound/Ulcer Post Debridement: Improved Severity of Tissue Post Debridement: Fat layer exposed Post Procedure Diagnosis Same as Pre-procedure Electronic Signature(s) Signed: 06/24/2020 5:31:38 PM By: Levan Hurst RN, BSN Signed: 06/25/2020 7:37:15 AM By: Linton Ham MD Entered By: Levan Hurst on 06/24/2020 16:33:24 -------------------------------------------------------------------------------- Debridement Details Patient Name: Date of Service: Jeremy Novak, MA Jeremy Lang 06/24/2020 2:45 PM Medical Record Number: [id number] Patient Account Number: [id number] Date of Birth/Sex: Treating RN: [birth year] Jeremy Lang (54 y.o. Janyth Contes Primary Care Provider: PA TIENT, NO Other Clinician: Referring Provider: Treating Provider/Extender: Cheree Ditto in Treatment: 0 Debridement Performed for Assessment: Wound #1 Right T Great oe Performed By: Physician Ricard Dillon., MD Debridement Type: Debridement Severity of Tissue Pre Debridement: Fat layer exposed Level of Consciousness (Pre-procedure): Awake and Alert Pre-procedure Verification/Time Out Yes - 16:32 Taken: Start Time: 16:32 T Area Debrided (L x W): otal 1.4 (cm) x 1.5 (cm) = 2.1 (cm) Tissue and other material debrided: Viable, Non-Viable, Slough, Subcutaneous, Slough Level: Skin/Subcutaneous Tissue Debridement Description: Excisional Instrument: Curette Bleeding: Minimum Hemostasis Achieved: Silver Nitrate End Time: 16:34 Procedural Pain: 0 Post Procedural Pain: 0 Response to Treatment: Procedure was tolerated well Level of Consciousness (Post- Awake and Alert procedure): Post Debridement Measurements of Total Wound Length: (cm) 1.4 Width: (cm) 1.5 Depth: (cm) 0.1 Volume: (cm) 0.165 Character of Wound/Ulcer Post Debridement: Improved Severity of Tissue  Post Debridement: Fat layer exposed Post Procedure Diagnosis Same as Pre-procedure Electronic Signature(s) Signed: 06/24/2020 5:31:38 PM By: Levan Hurst RN, BSN Signed: 06/25/2020 7:37:15 AM By: Linton Ham MD Entered By: Levan Hurst on 06/24/2020 16:35:04 -------------------------------------------------------------------------------- HPI Details Patient Name: Date of Service: Jeremy Novak, MA Jeremy Lang 06/24/2020 2:45 PM Medical Record Number: [id number] Patient Account Number: [id number] Date of Birth/Sex: Treating RN: Jeremy Lang[birth year] (54 y.o. Janyth Contes Primary Care Provider: PA Darnelle Spangle Other Clinician: Referring Provider: Treating Provider/Extender: Cheree Ditto in  Treatment: 0 History of Present Illness HPI Description: ADMIISSION 06/24/20 This is a [age]-year-old man who recently relocated to this area from [facility]. He is a type II diabetic on insulin and he's been dealing with wounds on the right foot. He has been in the ER on 2 occasions since he arrived here on 8/14 and again on 8/30. He had an x-ray that was negative. He's been through courses of cephalexin and doxycycline and then more recently on 8/30 clindamycin. He's finished both of those. Also asked that he had a wound on the right plantar first toe for a year and a half and the right fifth met head for a year. He saw somebody at one point did suggest that he needed a right great toe amputation and also amputation of the left fourth and fifth toes but he didn't go through with it. Using topical antibiotics to the wounds otherwise no protective dressings and no offloading. Past medical history includes CVA, type 2 diabetes, seizures with schizophrenia. He tells [place] that he had an MRI of the right foot in [facility] although we don't see that care everywhere. he had [place] based Medicaid when he was in [facility] he is having a difficult time getting this transferred to [facility] however this is not an easy  process AV INR clinic was 0.96 onn the right Electronic Signature(s) Signed: 06/25/2020 7:37:15 AM By: Linton Ham MD Entered By: Linton Ham on 06/24/2020 17:00:45 -------------------------------------------------------------------------------- Physical Exam Details Patient Name: Date of Service: Jeremy Novak, MA Jeremy Lang 06/24/2020 2:45 PM Medical Record Number: [id number] Patient Account Number: [id number] Date of Birth/Sex: Treating RN: Lang/[birth year]/Jeremy Lang (54 y.o. Janyth Contes Primary Care Provider: PA Haig Prophet, NO Other Clinician: Referring Provider: Treating Provider/Extender: Cheree Ditto in Treatment: 0 Constitutional Sitting or standing Blood Pressure is within target range for patient.. Pulse regular and within target range for patient.Marland Kitchen Respirations regular, non-labored and within target range.. Temperature is normal and within the target range for the patient.Marland Kitchen Respiratory work of breathing is normal. shallow air entry bilaterally. Cardiovascular Heart rhythm and rate regular, without murmur or gallop.. Pedal pulses palpable and strong bilaterally.. Neurological markedly reduced light touch to the monofilament and markedly reduced vibration sense. Psychiatric appears at normal baseline. Notes wound exam; he has a circular area on the plantar right first toe and on the plantar right fifth met head. Both of these thick edges and debris on the wound surface debridement with a #5 curet to clean up the wound surface. There is no real evidence of infection. No cultures were done Electronic Signature(s) Signed: 06/25/2020 7:37:15 AM By: Linton Ham MD Entered By: Linton Ham on 06/24/2020 17:03:47 -------------------------------------------------------------------------------- Physician Orders Details Patient Name: Date of Service: Jeremy Novak, MA Jeremy Lang 06/24/2020 2:45 PM Medical Record Number: [id number] Patient Account Number: [id number] Date of  Birth/Sex: Treating RN: [birth year]Jeremy Lang (54 y.o. Janyth Contes Primary Care Provider: PA Haig Prophet, NO Other Clinician: Referring Provider: Treating Provider/Extender: Cheree Ditto in Treatment: 0 Verbal / Phone Orders: No Diagnosis Coding Follow-up Appointments Return Appointment in 1 week. Dressing Change Frequency Wound #1 Right T Great oe Change Dressing every other day. Wound #2 Right,Plantar Metatarsal head fifth Change Dressing every other day. Wound Cleansing May shower and wash wound with soap and water. - on days that dressing is changed Primary Wound Dressing Wound #1 Right T Great oe Calcium Alginate with Silver Wound #2 Right,Plantar Metatarsal head fifth Calcium Alginate with Silver Secondary Dressing Wound #1 Right  T Great oe Kerlix/Rolled Gauze Dry Gauze Wound #2 Right,Plantar Metatarsal head fifth Kerlix/Rolled Gauze Dry Gauze Off-Loading Wedge shoe to: - front foot Dietitian) Signed: 06/24/2020 5:31:38 PM By: Levan Hurst RN, BSN Signed: 06/25/2020 7:37:15 AM By: Linton Ham MD Entered By: Levan Hurst on 06/24/2020 16:40:17 -------------------------------------------------------------------------------- Problem List Details Patient Name: Date of Service: Jeremy Novak, MA Jeremy Lang 06/24/2020 2:45 PM Medical Record Number: [id number] Patient Account Number: [id number] Date of Birth/Sex: Treating RN: [birth year] (54 y.o. Janyth Contes Primary Care Provider: PA Haig Prophet, NO Other Clinician: Referring Provider: Treating Provider/Extender: Cheree Ditto in Treatment: 0 Active Problems ICD-10 Encounter Code Description Active Date MDM Diagnosis E11.621 Type 2 diabetes mellitus with foot ulcer 06/24/2020 No Yes L97.518 Non-pressure chronic ulcer of other part of right foot with other specified 06/24/2020 No Yes severity E11.42 Type 2 diabetes mellitus with diabetic polyneuropathy 06/24/2020 No Yes Inactive  Problems Resolved Problems Electronic Signature(s) Signed: 06/25/2020 7:37:15 AM By: Linton Ham MD Entered By: Linton Ham on 06/24/2020 16:46:08 -------------------------------------------------------------------------------- Progress Note Details Patient Name: Date of Service: Jeremy Novak, MA Jeremy Lang 06/24/2020 2:45 PM Medical Record Number: [id number] Patient Account Number: [id number] Date of Birth/Sex: Treating RN: [birth year] (54 y.o. Janyth Contes Primary Care Provider: PA Haig Prophet, NO Other Clinician: Referring Provider: Treating Provider/Extender: Cheree Ditto in Treatment: 0 Subjective Chief Complaint Information obtained from Patient 06/24/20; patient is here for review wounds on his right foot History of Present Illness (HPI) ADMIISSION 06/24/20 This is a [age]-year-old man who recently relocated to this area from [facility]. He is a type II diabetic on insulin and he's been dealing with wounds on the right foot. He has been in the ER on 2 occasions since he arrived here on 8/14 and again on 8/30. He had an x-ray that was negative. He's been through courses of cephalexin and doxycycline and then more recently on 8/30 clindamycin. He's finished both of those. Also asked that he had a wound on the right plantar first toe for a year and a half and the right fifth met head for a year. He saw somebody at one point did suggest that he needed a right great toe amputation and also amputation of the left fourth and fifth toes but he didn't go through with it. Using topical antibiotics to the wounds otherwise no protective dressings and no offloading. Past medical history includes CVA, type 2 diabetes, seizures with schizophrenia. He tells [place] that he had an MRI of the right foot in [facility] although we don't see that care everywhere. he had [place] based Medicaid when he was in [facility] he is having a difficult time getting this transferred to [facility] however this is  not an easy process AV INR clinic was 0.96 onn the right Patient History Information obtained from Patient. Allergies No Known Allergies Family History Cancer - Maternal Grandparents,Paternal Grandparents,Mother,Father,Siblings, Diabetes - Mother,Father,Siblings,Maternal Grandparents, Heart Disease - Mother,Father,Maternal Grandparents,Siblings, Hypertension - Mother,Father,Maternal Grandparents,Siblings, Lung Disease - Maternal Grandparents, Stroke - Mother,Siblings, No family history of Hereditary Spherocytosis, Kidney Disease, Seizures, Thyroid Problems, Tuberculosis. Social History Current every day smoker, Marital Status - Single, Alcohol Use - Never, Drug Use - No History, Caffeine Use - Never. Medical History Eyes Patient has history of Cataracts, Glaucoma Cardiovascular Patient has history of Hypertension, Myocardial Infarction - x3 Endocrine Patient has history of Type II Diabetes Integumentary (Skin) Denies history of History of Burn Musculoskeletal Patient has history of Osteomyelitis Neurologic Patient has history of Neuropathy,  Seizure Disorder Patient is treated with Insulin. Blood sugar is tested. Medical A Surgical History Notes nd Cardiovascular CABG stroke Review of Systems (ROS) Constitutional Symptoms (General Health) Denies complaints or symptoms of Fatigue, Fever, Chills, Marked Weight Change. Ear/Nose/Mouth/Throat Denies complaints or symptoms of Chronic sinus problems or rhinitis. Respiratory Denies complaints or symptoms of Chronic or frequent coughs, Shortness of Breath. Gastrointestinal Denies complaints or symptoms of Frequent diarrhea, Nausea, Vomiting. Genitourinary Denies complaints or symptoms of Frequent urination. Integumentary (Skin) right foot Musculoskeletal Denies complaints or symptoms of Muscle Pain, Muscle Weakness. Psychiatric Denies complaints or symptoms of Claustrophobia, Suicidal. Objective Constitutional Sitting or  standing Blood Pressure is within target range for patient.. Pulse regular and within target range for patient.Marland Kitchen Respirations regular, non-labored and within target range.. Temperature is normal and within the target range for the patient.. Vitals Time Taken: 3:23 PM, Height: 67 in, Source: Stated, Weight: 234 lbs, Source: Stated, BMI: 36.6, Temperature: 98.3 F, Pulse: 69 bpm, Respiratory Rate: 19 breaths/min, Blood Pressure: 135/85 mmHg, Capillary Blood Glucose: 279 mg/dl. General Notes: patient stated CBG this morning was 279 Respiratory work of breathing is normal. shallow air entry bilaterally. Cardiovascular Heart rhythm and rate regular, without murmur or gallop.. Pedal pulses palpable and strong bilaterally.. Neurological markedly reduced light touch to the monofilament and markedly reduced vibration sense. Psychiatric appears at normal baseline. General Notes: wound exam; he has a circular area on the plantar right first toe and on the plantar right fifth met head. Both of these thick edges and debris on the wound surface debridement with a #5 curet to clean up the wound surface. There is no real evidence of infection. No cultures were done Integumentary (Hair, Skin) Wound #1 status is Open. Original cause of wound was Gradually Appeared. The wound is located on the Right T Great. The wound measures 1.4cm length x oe 1.5cm width x 0.1cm depth; 1.649cm^2 area and 0.165cm^3 volume. There is Fat Layer (Subcutaneous Tissue) exposed. There is no tunneling or undermining noted. There is a small amount of serous drainage noted. The wound margin is distinct with the outline attached to the wound base. There is medium (34-66%) pale granulation within the wound bed. There is a medium (34-66%) amount of necrotic tissue within the wound bed including Adherent Slough. Wound #2 status is Open. Original cause of wound was Gradually Appeared. The wound is located on the Right,Plantar Metatarsal head  fifth. The wound measures 1.7cm length x 2.1cm width x 0.6cm depth; 2.804cm^2 area and 1.682cm^3 volume. There is Fat Layer (Subcutaneous Tissue) exposed. There is no tunneling noted, however, there is undermining starting at 12:00 and ending at 3:00. There is a small amount of serous drainage noted. The wound margin is well defined and not attached to the wound base. There is small (1-33%) pink granulation within the wound bed. There is a large (67-100%) amount of necrotic tissue within the wound bed including Eschar and Adherent Slough. Assessment Active Problems ICD-10 Type 2 diabetes mellitus with foot ulcer Non-pressure chronic ulcer of other part of right foot with other specified severity Type 2 diabetes mellitus with diabetic polyneuropathy Procedures Wound #1 Pre-procedure diagnosis of Wound #1 is a Diabetic Wound/Ulcer of the Lower Extremity located on the Right T Great .Severity of Tissue Pre Debridement is: oe Fat layer exposed. There was a Excisional Skin/Subcutaneous Tissue Debridement with a total area of 2.1 sq cm performed by Ricard Dillon., MD. With the following instrument(s): Curette to remove Viable and Non-Viable tissue/material. Material removed  includes Subcutaneous Tissue and Slough and. No specimens were taken. A time out was conducted at 16:32, prior to the start of the procedure. A Minimum amount of bleeding was controlled with Silver Nitrate. The procedure was tolerated well with a pain level of 0 throughout and a pain level of 0 following the procedure. Post Debridement Measurements: 1.4cm length x 1.5cm width x 0.1cm depth; 0.165cm^3 volume. Character of Wound/Ulcer Post Debridement is improved. Severity of Tissue Post Debridement is: Fat layer exposed. Post procedure Diagnosis Wound #1: Same as Pre-Procedure Wound #2 Pre-procedure diagnosis of Wound #2 is a Diabetic Wound/Ulcer of the Lower Extremity located on the Right,Plantar Metatarsal head fifth  .Severity of Tissue Pre Debridement is: Fat layer exposed. There was a Excisional Skin/Subcutaneous Tissue Debridement with a total area of 3.57 sq cm performed by Ricard Dillon., MD. With the following instrument(s): Curette to remove Viable and Non-Viable tissue/material. Material removed includes Subcutaneous Tissue and Slough and. No specimens were taken. A time out was conducted at 16:32, prior to the start of the procedure. A Minimum amount of bleeding was controlled with Silver Nitrate. The procedure was tolerated well with a pain level of 0 throughout and a pain level of 0 following the procedure. Post Debridement Measurements: 1.7cm length x 2.1cm width x 0.6cm depth; 1.682cm^3 volume. Character of Wound/Ulcer Post Debridement is improved. Severity of Tissue Post Debridement is: Fat layer exposed. Post procedure Diagnosis Wound #2: Same as Pre-Procedure Plan Follow-up Appointments: Return Appointment in 1 week. Dressing Change Frequency: Wound #1 Right T Great: oe Change Dressing every other day. Wound #2 Right,Plantar Metatarsal head fifth: Change Dressing every other day. Wound Cleansing: May shower and wash wound with soap and water. - on days that dressing is changed Primary Wound Dressing: Wound #1 Right T Great: oe Calcium Alginate with Silver Wound #2 Right,Plantar Metatarsal head fifth: Calcium Alginate with Silver Secondary Dressing: Wound #1 Right T Great: oe Kerlix/Rolled Gauze Dry Gauze Wound #2 Right,Plantar Metatarsal head fifth: Kerlix/Rolled Gauze Dry Gauze Off-Loading: Wedge shoe to: - front foot offloader #1 use silver alginate to the wound surface. Our options for addressing this wound are limited by lack of insurance/Medicaid. However Medicaid does not pay for wound care supplies either. We've asked him to change this every second day #2 I gave him a forefoot offloading boot to see if we can take some of the pressure off these wounds #3 I don't  think he has any infection and no arterial issues. #4 the patient had a negative x-ray in the ER needs completed 2 rounds of antibiotics I didn't think any additional antibiotics were necessary. Nor did I really feel an advanced imaging test was necessary at this point i.e. MRI. #5 I think these are largely neuropathic ulcers. If we could offload these aggressively enough they might actually progress towards healing I spent 35 minutes in review of this patient's past medical records, face-to-face evaluation and preparation of this note Electronic Signature(s) Signed: 06/25/2020 7:37:15 AM By: Linton Ham MD Entered By: Linton Ham on 06/24/2020 17:Lang:22 -------------------------------------------------------------------------------- HxROS Details Patient Name: Date of Service: Jeremy Novak, MA Jeremy Lang 06/24/2020 2:45 PM Medical Record Number: [id number] Patient Account Number: [id number] Date of Birth/Sex: Treating RN: [birth year] Jeremy Lang (54 y.o. Marvis Repress Primary Care Provider: PA Haig Prophet, NO Other Clinician: Referring Provider: Treating Provider/Extender: Cheree Ditto in Treatment: 0 Information Obtained From Patient Constitutional Symptoms (General Health) Complaints and Symptoms: Negative for: Fatigue; Fever; Chills; Marked Weight Change Ear/Nose/Mouth/Throat Complaints and Symptoms:  Negative for: Chronic sinus problems or rhinitis Respiratory Complaints and Symptoms: Negative for: Chronic or frequent coughs; Shortness of Breath Gastrointestinal Complaints and Symptoms: Negative for: Frequent diarrhea; Nausea; Vomiting Genitourinary Complaints and Symptoms: Negative for: Frequent urination Musculoskeletal Complaints and Symptoms: Negative for: Muscle Pain; Muscle Weakness Medical History: Positive for: Osteomyelitis Psychiatric Complaints and Symptoms: Negative for: Claustrophobia; Suicidal Eyes Medical History: Positive for: Cataracts;  Glaucoma Hematologic/Lymphatic Cardiovascular Medical History: Positive for: Hypertension; Myocardial Infarction - x3 Past Medical History Notes: CABG stroke Endocrine Medical History: Positive for: Type II Diabetes Time with diabetes: 20 years Treated with: Insulin Blood sugar tested every day: Yes Tested : Immunological Integumentary (Skin) Complaints and Symptoms: Review of System Notes: right foot Medical History: Negative for: History of Burn Neurologic Medical History: Positive for: Neuropathy; Seizure Disorder Oncologic HBO Extended History Items Eyes: Eyes: Cataracts Glaucoma Immunizations Pneumococcal Vaccine: Received Pneumococcal Vaccination: No Implantable Devices None Family and Social History Cancer: Yes - Maternal Grandparents,Paternal Grandparents,Mother,Father,Siblings; Diabetes: Yes - Mother,Father,Siblings,Maternal Grandparents; Heart Disease: Yes - Mother,Father,Maternal Grandparents,Siblings; Hereditary Spherocytosis: No; Hypertension: Yes - Mother,Father,Maternal Grandparents,Siblings; Kidney Disease: No; Lung Disease: Yes - Maternal Grandparents; Seizures: No; Stroke: Yes - Mother,Siblings; Thyroid Problems: No; Tuberculosis: No; Current every day smoker; Marital Status - Single; Alcohol Use: Never; Drug Use: No History; Caffeine Use: Never; Financial Concerns: No; Food, Clothing or Shelter Needs: No; Support System Lacking: No; Transportation Concerns: No Electronic Signature(s) Signed: 06/24/2020 5:24:24 PM By: Kela Millin Signed: 06/25/2020 7:37:15 AM By: Linton Ham MD Entered By: Kela Millin on 06/24/2020 16:15:40 -------------------------------------------------------------------------------- SuperBill Details Patient Name: Date of Service: Jeremy Novak, MA Jeremy Lang 06/24/2020 Medical Record Number: [id number] Patient Account Number: [id number] Date of Birth/Sex: Treating RN: Jeremy [birth year] (54 y.o. Janyth Contes Primary Care  Provider: PA TIENT, NO Other Clinician: Referring Provider: Treating Provider/Extender: Cheree Ditto in Treatment: 0 Diagnosis Coding ICD-10 Codes Code Description E11.621 Type 2 diabetes mellitus with foot ulcer L97.518 Non-pressure chronic ulcer of other part of right foot with other specified severity E11.42 Type 2 diabetes mellitus with diabetic polyneuropathy Facility Procedures CPT4 Code: [id number] Description: 99213 - WOUND CARE VISIT-LEV 3 EST PT Modifier: 25 Quantity: 1 CPT4 Code: [id number] Description: 90379 - DEB SUBQ TISSUE 20 SQ CM/< ICD-10 Diagnosis Description L97.518 Non-pressure chronic ulcer of other part of right foot with other specified sever E11.621 Type 2 diabetes mellitus with foot ulcer Modifier: ity Quantity: 1 Physician Procedures : CPT4 Code Description Modifier [id number] [place] PHYS LEVEL 3 NEW PT 25 ICD-10 Diagnosis Description E11.621 Type 2 diabetes mellitus with foot ulcer E11.42 Type 2 diabetes mellitus with diabetic polyneuropathy L97.518 Non-pressure chronic ulcer of other part  of right foot with other specified severity Quantity: 1 : [id number] - WC PHYS SUBQ TISS 20 SQ CM ICD-10 Diagnosis Description L97.518 Non-pressure chronic ulcer of other part of right foot with other specified severity E11.621 Type 2 diabetes mellitus with foot ulcer Quantity: 1 Electronic Signature(s) Signed: 06/24/2020 5:31:38 PM By: Levan Hurst RN, BSN Signed: 06/25/2020 7:37:15 AM By: Linton Ham MD Entered By: Levan Hurst on 06/24/2020 17:31:16

## 2020-06-25 NOTE — Progress Notes (Addendum)
Jeremy Lang ([id number]) Visit Report for 06/24/2020 Allergy List Details Patient Name: Date of Service: Jeremy Lang Jeremy Lang, [name] 06/24/2020 2:45 PM Medical Record Number: [id number] Patient Account Number: [id number] Date of Birth/Sex: Treating RN: Nov 29, 1965 (54 y.o. Jeremy Lang Primary Care Aris Moman: PA Zenovia Jordan, NO Other Clinician: Referring Neely Kammerer: Treating Kalysta Kneisley/Extender: Valentino Saxon in Treatment: 0 Allergies Active Allergies No Known Allergies Allergy Notes Electronic Signature(s) Signed: 06/24/2020 5:24:24 PM By: Cherylin Mylar Entered By: Cherylin Mylar on 06/24/2020 15:26:08 -------------------------------------------------------------------------------- Arrival Information Details Patient Name: Date of Service: Jeremy Booze, [name] 06/24/2020 2:45 PM Medical Record Number: [id number] Patient Account Number: [id number] Date of Birth/Sex: Treating RN: 1966-06-05 (54 y.o. Jeremy Lang Primary Care Kehaulani Fruin: PA Zenovia Jordan, NO Other Clinician: Referring Ashan Cueva: Treating Danyle Boening/Extender: Valentino Saxon in Treatment: 0 Visit Information Patient Arrived: Jeremy Lang Time: 15:14 Accompanied By: self Transfer Assistance: None Patient Identification Verified: Yes Secondary Verification Process Completed: Yes Patient Requires Transmission-Based Precautions: No Patient Has Alerts: Yes Patient Alerts: Patient on Blood Thinner takes aspirin Electronic Signature(s) Signed: 06/24/2020 5:24:24 PM By: Cherylin Mylar Entered By: Cherylin Mylar on 06/24/2020 16:11:09 -------------------------------------------------------------------------------- Clinic Level of Care Assessment Details Patient Name: Date of ServiceWaynard Lang [place], [name] 06/24/2020 2:45 PM Medical Record Number: [id number] Patient Account Number: [id number] Date of Birth/Sex: Treating RN: 03/02/1966 (54 y.o. Jeremy Lang Primary Care Laylaa Guevarra: PA Zenovia Jordan, NO Other  Clinician: Referring Cassidey Barrales: Treating Pattrick Bady/Extender: Valentino Saxon in Treatment: 0 Clinic Level of Care Assessment Items TOOL 1 Quantity Score X- 1 0 Use when EandM and Procedure is performed on INITIAL visit ASSESSMENTS - Nursing Assessment / Reassessment X- 1 20 General Physical Exam (combine w/ comprehensive assessment (listed just below) when performed on new pt. evals) X- 1 25 Comprehensive Assessment (HX, ROS, Risk Assessments, Wounds Hx, etc.) ASSESSMENTS - Wound and Skin Assessment / Reassessment []  - 0 Dermatologic / Skin Assessment (not related to wound area) ASSESSMENTS - Ostomy and/or Continence Assessment and Care []  - 0 Incontinence Assessment and Management []  - 0 Ostomy Care Assessment and Management (repouching, etc.) PROCESS - Coordination of Care X - Simple Patient / Family Education for ongoing care 1 15 []  - 0 Complex (extensive) Patient / Family Education for ongoing care X- 1 10 Staff obtains , Records, T Results / Process Orders est []  - 0 Staff telephones HHA, Nursing Homes / Clarify orders / etc []  - 0 Routine Transfer to another Facility (non-emergent condition) []  - 0 Routine Hospital Admission (non-emergent condition) X- 1 15 New Admissions / / Ordering NPWT Apligraf, etc. , []  - 0 Emergency Hospital Admission (emergent condition) PROCESS - Special Needs []  - 0 Pediatric / Minor Patient Management []  - 0 Isolation Patient Management []  - 0 Hearing / Language / Visual special needs []  - 0 Assessment of Community assistance (transportation, D/C planning, etc.) []  - 0 Additional assistance / Altered mentation []  - 0 Support Surface(s) Assessment (bed, cushion, seat, etc.) INTERVENTIONS - Miscellaneous []  - 0 External ear exam []  - 0 Patient Transfer (multiple staff / / Similar devices) []  - 0 Simple Staple / Suture removal (25 or less) []  - 0 Complex Staple / Suture removal  (26 or more) []  - 0 Hypo/Hyperglycemic Management (do not check if billed separately) X- 1 15 Ankle / Brachial Index (ABI) - do not check if billed separately Has the patient been seen at the hospital within the last three years: Yes Total Score: 100 Level Of Care:  New/Established - Level 3 Electronic Signature(s) Signed: 06/24/2020 5:31:38 PM By: Zandra Abts RN, BSN Entered By: Zandra Abts on 06/24/2020 17:31:05 -------------------------------------------------------------------------------- Encounter Discharge Information Details Patient Name: Date of Service: Jeremy Booze, [name] 06/24/2020 2:45 PM Medical Record Number: [id number] Patient Account Number: [id number] Date of Birth/Sex: Treating RN: 1966/04/09 (54 y.o. Jeremy Lang Primary Care Lorenzo Arscott: PA Zenovia Jordan, NO Other Clinician: Referring Sabas Frett: Treating Amilya Haver/Extender: Valentino Saxon in Treatment: 0 Encounter Discharge Information Items Post Procedure Vitals Discharge Condition: Stable Temperature (F): 98.3 Ambulatory Status: Walker Pulse (bpm): 69 Discharge Destination: Home Respiratory Rate (breaths/min): 19 Transportation: Private Auto Blood Pressure (mmHg): 135/85 Accompanied By: self Schedule Follow-up Appointment: Yes Clinical Summary of Care: Electronic Signature(s) Signed: 06/24/2020 5:26:25 PM By: Shawn Stall Entered By: Shawn Stall on 06/24/2020 17:16:29 -------------------------------------------------------------------------------- Lower Extremity Assessment Details Patient Name: Date of ServiceRodney Lang, [name] 06/24/2020 2:45 PM Medical Record Number: [id number] Patient Account Number: [id number] Date of Birth/Sex: Treating RN: 1966/05/01 (54 y.o. Jeremy Lang Primary Care Chella Chapdelaine: PA Zenovia Jordan, NO Other Clinician: Referring Londynn Sonoda: Treating Dessiree Sze/Extender: Valentino Saxon in Treatment: 0 Edema Assessment Assessed: [Left: No] [Lang: No] Edema: [Left:  Ye] [Lang: s] Calf Left: Lang: Point of Measurement: From Medial Instep 24 cm Ankle Left: Lang: Point of Measurement: From Medial Instep 36 cm Vascular Assessment Pulses: Dorsalis Pedis Palpable: [Lang:No] Blood Pressure: Brachial: [Lang:135] Ankle: [Lang:Dorsalis Pedis: 130 0.96] Electronic Signature(s) Signed: 06/24/2020 5:24:24 PM By: Cherylin Mylar Entered By: Cherylin Mylar on 06/24/2020 16:00:45 -------------------------------------------------------------------------------- Multi Wound Chart Details Patient Name: Date of Service: Jeremy Booze, [name] 06/24/2020 2:45 PM Medical Record Number: [id number] Patient Account Number: [id number] Date of Birth/Sex: Treating RN: January 14, 1966 (54 y.o. Jeremy Lang Primary Care Tawny Raspberry: PA TIENT, NO Other Clinician: Referring Sudiksha Victor: Treating Osie Merkin/Extender: Valentino Saxon in Treatment: 0 Vital Signs Height(in): 67 Capillary Blood Glucose(mg/dl): 086 Weight(lbs): 578 Pulse(bpm): 69 Body Mass Index(BMI): 37 Blood Pressure(mmHg): 135/85 Temperature(F): 98.3 Respiratory Rate(breaths/min): 19 Photos: [1:No Photos Lang T Great oe] [2:No Photos Lang, Plantar Metatarsal head fifth N/A] [N/A:N/A] Wound Location: [1:Gradually Appeared] [2:Gradually Appeared] [N/A:N/A] Wounding Event: [1:Diabetic Wound/Ulcer of the Lower] [2:Diabetic Wound/Ulcer of the Lower] [N/A:N/A] Primary Etiology: [1:Extremity Cataracts, Glaucoma, Hypertension, Cataracts, Glaucoma, Hypertension, N/A] [2:Extremity] Comorbid History: [1:Myocardial Infarction, Type II Diabetes, Neuropathy, Seizure Disorder 09/28/2018] [2:Myocardial Infarction, Type II Diabetes, Neuropathy, Seizure Disorder 06/18/2019] [N/A:N/A] Date Acquired: [1:0] [2:0] [N/A:N/A] Weeks of Treatment: [1:Open] [2:Open] [N/A:N/A] Wound Status: [1:1.4x1.5x0.1] [2:1.7x2.1x0.6] [N/A:N/A] Measurements L x W x D (cm) [1:1.649] [2:2.804] [N/A:N/A] A (cm) : rea [1:0.165]  [2:1.682] [N/A:N/A] Volume (cm) : [2:12] Starting Position 1 (o'clock): [2:3] Ending Position 1 (o'clock): [1:No] [2:Yes] [N/A:N/A] Undermining: [1:Grade 2] [2:Grade 2] [N/A:N/A] Classification: [1:Small] [2:Small] [N/A:N/A] Exudate A mount: [1:Serous] [2:Serous] [N/A:N/A] Exudate Type: [1:amber] [2:amber] [N/A:N/A] Exudate Color: [1:Distinct, outline attached] [2:Well defined, not attached] [N/A:N/A] Wound Margin: [1:Medium (34-66%)] [2:Small (1-33%)] [N/A:N/A] Granulation A mount: [1:Pale] [2:Pink] [N/A:N/A] Granulation Quality: [1:Medium (34-66%)] [2:Large (67-100%)] [N/A:N/A] Necrotic A mount: [1:Adherent Slough] [2:Eschar, Adherent Slough] [N/A:N/A] Necrotic Tissue: [1:Fat Layer (Subcutaneous Tissue): Yes Fat Layer (Subcutaneous Tissue): Yes N/A] Exposed Structures: [1:Fascia: No Tendon: No Muscle: No Joint: No Bone: No None] [2:Fascia: No Tendon: No Muscle: No Joint: No Bone: No None] [N/A:N/A] Epithelialization: [1:Debridement - Excisional] [2:Debridement - Excisional] [N/A:N/A] Debridement: Pre-procedure Verification/Time Out 16:32 [2:16:32] [N/A:N/A] Taken: [1:Subcutaneous, Slough] [2:Subcutaneous, Slough] [N/A:N/A] Tissue Debrided: [1:Skin/Subcutaneous Tissue] [2:Skin/Subcutaneous Tissue] [N/A:N/A] Level: [1:2.1] [2:3.57] [N/A:N/A] Debridement A (sq cm): [1:rea Curette] [2:Curette] [N/A:N/A] Instrument: [1:Minimum] [2:Minimum] [N/A:N/A] Bleeding: [1:Silver  Nitrate] [2:Silver Nitrate] [N/A:N/A] Hemostasis A chieved: [1:0] [2:0] [N/A:N/A] Procedural Pain: [1:0] [2:0] [N/A:N/A] Post Procedural Pain: [1:Procedure was tolerated well] [2:Procedure was tolerated well] [N/A:N/A] Debridement Treatment Response: [1:1.4x1.5x0.1] [2:1.7x2.1x0.6] [N/A:N/A] Post Debridement Measurements L x W x D (cm) [1:0.165] [2:1.682] [N/A:N/A] Post Debridement Volume: (cm) [1:Debridement] [2:Debridement] [N/A:N/A] Treatment Notes Electronic Signature(s) Signed: 06/24/2020 5:31:38 PM By: Zandra AbtsLynch,  Shatara RN, BSN Signed: 06/25/2020 7:37:15 AM By: Baltazar Najjarobson, Michael MD Entered By: Baltazar Najjarobson, Michael on 06/24/2020 16:56:59 -------------------------------------------------------------------------------- Multi-Disciplinary Care Plan Details Patient Name: Date of Service: Jeremy Lang [place], [name] 06/24/2020 2:45 PM Medical Record Number: [id number] Patient Account Number: [id number] Date of Birth/Sex: Treating RN: 08/31/1966 (54 y.o. Jeremy KollerM) Lynch, Shatara Primary Care Caidynce Muzyka: PA Zenovia JordanIENT, NO Other Clinician: Referring Rc Amison: Treating Aadhav Uhlig/Extender: Valentino Saxonobson, Michael Weeks in Treatment: 0 Active Inactive Abuse / Safety / Falls / Self Care Management Nursing Diagnoses: Potential for falls Potential for injury related to falls Goals: Patient will not experience any injury related to falls Date Initiated: 06/24/2020 Target Resolution Date: 07/26/2020 Goal Status: Active Patient/caregiver will verbalize/demonstrate measures taken to prevent injury and/or falls Date Initiated: 06/24/2020 Target Resolution Date: 07/26/2020 Goal Status: Active Interventions: Assess Activities of Daily Living upon admission and as needed Assess fall risk on admission and as needed Assess: immobility, friction, shearing, incontinence upon admission and as needed Assess impairment of mobility on admission and as needed per policy Assess personal safety and home safety (as indicated) on admission and as needed Assess self care needs on admission and as needed Provide education on personal and home safety Notes: Nutrition Nursing Diagnoses: Impaired glucose control: actual or potential Potential for alteratiion in Nutrition/Potential for imbalanced nutrition Goals: Patient/caregiver agrees to and verbalizes understanding of need to use nutritional supplements and/or vitamins as prescribed Date Initiated: 06/24/2020 Target Resolution Date: 07/26/2020 Goal Status: Active Patient/caregiver verbalizes understanding  of need to maintain therapeutic glucose control per primary care physician Date Initiated: 06/24/2020 Target Resolution Date: 07/26/2020 Goal Status: Active Interventions: Assess HgA1c results as ordered upon admission and as needed Assess patient nutrition upon admission and as needed per policy Provide education on elevated blood sugars and impact on wound healing Provide education on nutrition Notes: Wound/Skin Impairment Nursing Diagnoses: Impaired tissue integrity Knowledge deficit related to ulceration/compromised skin integrity Goals: Patient/caregiver will verbalize understanding of skin care regimen Date Initiated: 06/24/2020 Target Resolution Date: 07/26/2020 Goal Status: Active Ulcer/skin breakdown will have a volume reduction of 30% by week 4 Date Initiated: 06/24/2020 Target Resolution Date: 07/26/2020 Goal Status: Active Interventions: Assess patient/caregiver ability to obtain necessary supplies Assess patient/caregiver ability to perform ulcer/skin care regimen upon admission and as needed Assess ulceration(s) every visit Provide education on ulcer and skin care Notes: Electronic Signature(s) Signed: 06/24/2020 5:31:38 PM By: Zandra AbtsLynch, Shatara RN, BSN Entered By: Zandra AbtsLynch, Shatara on 06/24/2020 17:29:23 -------------------------------------------------------------------------------- Pain Assessment Details Patient Name: Date of Service: Jeremy Lang [place], [name] 06/24/2020 2:45 PM Medical Record Number: [id number] Patient Account Number: [id number] Date of Birth/Sex: Treating RN: 05/16/1966 (54 y.o. Jeremy RightM) Dwiggins, Shannon Primary Care Finnleigh Marchetti: PA Zenovia JordanIENT, NO Other Clinician: Referring Lynise Porr: Treating Nala Kachel/Extender: Valentino Saxonobson, Michael Weeks in Treatment: 0 Active Problems Location of Pain Severity and Description of Pain Patient Has Paino Yes Site Locations Pain Location: Pain in Ulcers With Dressing Change: Yes Duration of the Pain. Constant / Intermittento  Intermittent How Long Does it Lasto Hours: Minutes: 4 Rate the pain. Current Pain Level: 2 Worst Pain Level: 5 Least Pain Level: 2 Tolerable Pain Level: 2 Character of Pain Describe the  Pain: Aching Pain Management and Medication Current Pain Management: Electronic Signature(s) Signed: 06/24/2020 5:24:24 PM By: Cherylin Mylar Entered By: Cherylin Mylar on 06/24/2020 15:55:25 -------------------------------------------------------------------------------- Patient/Caregiver Education Details Patient Name: Date of Service: Jeremy Booze, [name] 9/27/2021andnbsp2:45 PM Medical Record Number: [id number] Patient Account Number: [id number] Date of Birth/Gender: Treating RN: 07-Mar-1966 (54 y.o. Jeremy Lang Primary Care Physician: PA Zenovia Jordan, NO Other Clinician: Referring Physician: Treating Physician/Extender: Valentino Saxon in Treatment: 0 Education Assessment Education Provided To: Patient Education Topics Provided Elevated Blood Sugar/ Impact on Healing: Methods: Explain/Verbal Responses: State content correctly Nutrition: Methods: Explain/Verbal Responses: State content correctly Safety: Methods: Explain/Verbal Responses: State content correctly Wound/Skin Impairment: Methods: Explain/Verbal Responses: State content correctly Electronic Signature(s) Signed: 06/24/2020 5:31:38 PM By: Zandra Abts RN, BSN Entered By: Zandra Abts on 06/24/2020 17:29:39 -------------------------------------------------------------------------------- Wound Assessment Details Patient Name: Date of Service: Jeremy Booze, [name] 06/24/2020 2:45 PM Medical Record Number: [id number] Patient Account Number: [id number] Date of Birth/Sex: Treating RN: 07/30/1966 (54 y.o. Jeremy Lang Primary Care Kennett Symes: PA TIENT, NO Other Clinician: Referring Leocadia Idleman: Treating Brenen Beigel/Extender: Valentino Saxon in Treatment: 0 Wound Status Wound Number: 1 Primary Diabetic  Wound/Ulcer of the Lower Extremity Etiology: Wound Location: Lang T Great oe Wound Open Wounding Event: Gradually Appeared Status: Date Acquired: 09/28/2018 Comorbid Cataracts, Glaucoma, Hypertension, Myocardial Infarction, Type II Weeks Of Treatment: 0 History: Diabetes, Osteomyelitis, Neuropathy, Seizure Disorder Clustered Wound: No Photos Wound Measurements Length: (cm) 1.4 Width: (cm) 1.5 Depth: (cm) 0.1 Area: (cm) 1.649 Volume: (cm) 0.165 % Reduction in Area: 0% % Reduction in Volume: 0% Epithelialization: None Tunneling: No Undermining: No Wound Description Classification: Grade 2 Wound Margin: Distinct, outline attached Exudate Amount: Small Exudate Type: Serous Exudate Color: amber Foul Odor After Cleansing: No Slough/Fibrino Yes Wound Bed Granulation Amount: Medium (34-66%) Exposed Structure Granulation Quality: Pale Fascia Exposed: No Necrotic Amount: Medium (34-66%) Fat Layer (Subcutaneous Tissue) Exposed: Yes Necrotic Quality: Adherent Slough Tendon Exposed: No Muscle Exposed: No Joint Exposed: No Bone Exposed: No Treatment Notes Wound #1 (Lang Toe Great) 1. Cleanse With Wound Cleanser 3. Primary Dressing Applied Calcium Alginate Ag 4. Secondary Dressing Dry Gauze Roll Gauze 5. Secured With Medipore tape 7. Footwear/Offloading device applied Wedge shoe Notes educated patient on how to apply dressings, frequency of change, and when to return to wound center. patient in agreement. Electronic Signature(s) Signed: 06/26/2020 10:50:32 AM By: Benjaman Kindler EMT/HBOT/SD Signed: 06/26/2020 5:07:30 PM By: Cherylin Mylar Previous Signature: 06/24/2020 5:24:24 PM Version By: Cherylin Mylar Entered By: Benjaman Kindler on 06/26/2020 09:44:08 -------------------------------------------------------------------------------- Wound Assessment Details Patient Name: Date of Service: Jeremy Booze, [name] 06/24/2020 2:45 PM Medical Record Number:  [id number] Patient Account Number: [id number] Date of Birth/Sex: Treating RN: 1966-05-21 (54 y.o. Jeremy Lang Primary Care Raekwon Winkowski: PA Zenovia Jordan, NO Other Clinician: Referring Satvik Parco: Treating Chelan Heringer/Extender: Valentino Saxon in Treatment: 0 Wound Status Wound Number: 2 Primary Diabetic Wound/Ulcer of the Lower Extremity Etiology: Wound Location: Lang, Plantar Metatarsal head fifth Wound Open Wounding Event: Gradually Appeared Status: Date Acquired: 06/18/2019 Comorbid Cataracts, Glaucoma, Hypertension, Myocardial Infarction, Type Weeks Of Treatment: 0 History: II Diabetes, Neuropathy, Seizure Disorder Clustered Wound: No Photos Photo Uploaded By: Benjaman Kindler on 06/26/2020 09:43:35 Wound Measurements Length: (cm) 1.7 Width: (cm) 2.1 Depth: (cm) 0.6 Area: (cm) 2.804 Volume: (cm) 1.682 % Reduction in Area: % Reduction in Volume: Epithelialization: None Tunneling: No Undermining: Yes Starting Position (o'clock): 12 Ending Position (o'clock): 3 Wound Description Classification: Grade 2 Wound Margin: Well defined, not attached Exudate  Amount: Small Exudate Type: Serous Exudate Color: amber Foul Odor After Cleansing: No Slough/Fibrino Yes Wound Bed Granulation Amount: Small (1-33%) Exposed Structure Granulation Quality: Pink Fascia Exposed: No Necrotic Amount: Large (67-100%) Fat Layer (Subcutaneous Tissue) Exposed: Yes Necrotic Quality: Eschar, Adherent Slough Tendon Exposed: No Muscle Exposed: No Joint Exposed: No Bone Exposed: No Treatment Notes Wound #2 (Lang, Plantar Metatarsal head fifth) 1. Cleanse With Wound Cleanser 3. Primary Dressing Applied Calcium Alginate Ag 4. Secondary Dressing Dry Gauze Roll Gauze 5. Secured With Medipore tape 7. Footwear/Offloading device applied Wedge shoe Notes educated patient on how to apply dressings, frequency of change, and when to return to wound center. patient in agreement. Electronic  Signature(s) Signed: 06/24/2020 5:24:24 PM By: Cherylin Mylar Entered By: Cherylin Mylar on 06/24/2020 15:52:59 -------------------------------------------------------------------------------- Vitals Details Patient Name: Date of Service: Jeremy Booze, [name] 06/24/2020 2:45 PM Medical Record Number: [id number] Patient Account Number: [id number] Date of Birth/Sex: Treating RN: June 11, 1966 (54 y.o. Jeremy Lang Primary Care Wm Fruchter: PA Zenovia Jordan, NO Other Clinician: Referring Abbigail Anstey: Treating Yuto Cajuste/Extender: Valentino Saxon in Treatment: 0 Vital Signs Time Taken: 15:23 Temperature (F): 98.3 Height (in): 67 Pulse (bpm): 69 Source: Stated Respiratory Rate (breaths/min): 19 Weight (lbs): 234 Blood Pressure (mmHg): 135/85 Source: Stated Capillary Blood Glucose (mg/dl): 384 Body Mass Index (BMI): 36.6 Reference Range: 80 - 120 mg / dl Notes patient stated CBG this morning was 279 Electronic Signature(s) Signed: 06/24/2020 5:24:24 PM By: Cherylin Mylar Entered By: Cherylin Mylar on 06/24/2020 15:26:00

## 2020-07-03 ENCOUNTER — Inpatient Hospital Stay: Payer: Self-pay | Admitting: Family Medicine

## 2020-07-16 ENCOUNTER — Emergency Department (HOSPITAL_COMMUNITY): Payer: Medicaid Other

## 2020-07-16 ENCOUNTER — Other Ambulatory Visit: Payer: Self-pay

## 2020-07-16 ENCOUNTER — Emergency Department (HOSPITAL_COMMUNITY)
Admission: EM | Admit: 2020-07-16 | Discharge: 2020-07-16 | Disposition: A | Discharge status: Home / Self Care | Payer: Medicaid Other | Attending: Emergency Medicine | Admitting: Emergency Medicine

## 2020-07-16 DIAGNOSIS — R112 Nausea with vomiting, unspecified: Secondary | ICD-10-CM

## 2020-07-16 DIAGNOSIS — K3184 Gastroparesis: Secondary | ICD-10-CM | POA: Diagnosis not present

## 2020-07-16 DIAGNOSIS — R9341 Abnormal radiologic findings on diagnostic imaging of renal pelvis, ureter, or bladder: Secondary | ICD-10-CM | POA: Insufficient documentation

## 2020-07-16 DIAGNOSIS — Z7984 Long term (current) use of oral hypoglycemic drugs: Secondary | ICD-10-CM | POA: Insufficient documentation

## 2020-07-16 DIAGNOSIS — R102 Pelvic and perineal pain: Secondary | ICD-10-CM | POA: Diagnosis present

## 2020-07-16 DIAGNOSIS — E1143 Type 2 diabetes mellitus with diabetic autonomic (poly)neuropathy: Secondary | ICD-10-CM | POA: Insufficient documentation

## 2020-07-16 DIAGNOSIS — Z7982 Long term (current) use of aspirin: Secondary | ICD-10-CM | POA: Diagnosis not present

## 2020-07-16 DIAGNOSIS — R1084 Generalized abdominal pain: Secondary | ICD-10-CM

## 2020-07-16 LAB — CBC
HCT: 42.5 % (ref 39.0–52.0)
Hemoglobin: 14.5 g/dL (ref 13.0–17.0)
MCH: 29.4 pg (ref 26.0–34.0)
MCHC: 34.1 g/dL (ref 30.0–36.0)
MCV: 86.2 fL (ref 80.0–100.0)
Platelets: 209 10*3/uL (ref 150–400)
RBC: 4.93 MIL/uL (ref 4.22–5.81)
RDW: 12.9 % (ref 11.5–15.5)
WBC: 8 10*3/uL (ref 4.0–10.5)
nRBC: 0 % (ref 0.0–0.2)

## 2020-07-16 LAB — URINALYSIS, ROUTINE W REFLEX MICROSCOPIC
Bilirubin Urine: NEGATIVE
Glucose, UA: >=500 mg/dL — AB
Ketones, ur: NEGATIVE mg/dL
Leukocytes,Ua: NEGATIVE
Nitrite: NEGATIVE
Protein, ur: >=300 mg/dL — AB
Specific Gravity, Urine: 1.023 (ref 1.005–1.030)
pH: 5 (ref 5.0–8.0)

## 2020-07-16 LAB — BASIC METABOLIC PANEL
Anion gap: 8 (ref 5–15)
BUN: 14 mg/dL (ref 6–20)
CO2: 26 mmol/L (ref 22–32)
Calcium: 8.5 mg/dL — ABNORMAL LOW (ref 8.9–10.3)
Chloride: 100 mmol/L (ref 98–111)
Creatinine, Ser: 0.91 mg/dL (ref 0.61–1.24)
GFR, Estimated: >60 mL/min (ref >60)
Glucose, Bld: 192 mg/dL — ABNORMAL HIGH (ref 70–99)
Potassium: 3.9 mmol/L (ref 3.5–5.1)
Sodium: 134 mmol/L — ABNORMAL LOW (ref 135–145)

## 2020-07-16 LAB — TROPONIN I (HIGH SENSITIVITY)
Troponin I (High Sensitivity): 7 ng/L (ref <18)
Troponin I (High Sensitivity): 5 ng/L (ref <18)

## 2020-07-16 LAB — RAPID URINE DRUG SCREEN, HOSP PERFORMED
Amphetamines: NOT DETECTED
Barbiturates: NOT DETECTED
Benzodiazepines: NOT DETECTED
Cocaine: NOT DETECTED
Opiates: NOT DETECTED
Tetrahydrocannabinol: NOT DETECTED

## 2020-07-16 LAB — LIPASE, BLOOD: Lipase: 28 U/L (ref 11–51)

## 2020-07-16 MED ORDER — FENTANYL CITRATE (PF) 100 MCG/2ML IJ SOLN
50.0000 ug | Freq: Once | INTRAMUSCULAR | Status: AC
  Administered 2020-07-16: 50 ug via INTRAVENOUS
  Filled 2020-07-16: qty 2

## 2020-07-16 MED ORDER — ONDANSETRON HCL 4 MG/2ML IJ SOLN
4.0000 mg | Freq: Once | INTRAMUSCULAR | Status: AC
  Administered 2020-07-16: 4 mg via INTRAVENOUS
  Filled 2020-07-16: qty 2

## 2020-07-16 MED ORDER — METOCLOPRAMIDE HCL 10 MG PO TABS: 10.0000 mg | ORAL_TABLET | Freq: Three times a day (TID) | ORAL | 0 refills | Status: DC | PRN

## 2020-07-16 MED ORDER — SODIUM CHLORIDE 0.9 % IV BOLUS
1000.0000 mL | Freq: Once | INTRAVENOUS | Status: AC
  Administered 2020-07-16: 1000 mL via INTRAVENOUS

## 2020-07-16 MED ORDER — IOHEXOL 300 MG/ML  SOLN
100.0000 mL | Freq: Once | INTRAMUSCULAR | Status: AC | PRN
  Administered 2020-07-16: 100 mL via INTRAVENOUS

## 2020-07-16 NOTE — ED Provider Notes (Signed)
  Physical Exam  BP (!) 155/92   Pulse 63   Temp 98.6 F (37 C) (Oral)   Resp 16   Ht 5\' 7"  (1.702 m)   Wt 106.6 kg   SpO2 97%   BMI 36.81 kg/m   Physical Exam  ED Course/Procedures   Clinical Course as of Jul 16 1832  Tue Jul 16, 2020  1428 Patient noted to be shaking and groaning during my history and exam but looking at his phone in no apparent discomfort while no one is in his room.    [CS]  1429 CBC is normal.    [CS]  1449 Lipase is normal.    [CS]  1508 BMP and Trop are normal.    [CS]  1537 Patient in no distress while being transported to CT.    [CS]  1557 CT images and results reviewed. Bladder wall abnormality mentioned to patient. Need UA here and Urology follow up as an outpatient.    [CS]  1608 UA is pending. Patient also may be having gastroparesis. Plan discharge with outpatient PCP follow up, referred to [facility] as he does not yet have a PCP due to his recent move. Rx for Reglan for continued symptom control. Abx if UA positive. Care signed out to Dr. [facility] pending UA result.    [CS]    Clinical Course User Index [CS] Dalene Seltzer, MD    Procedures  MDM  Received care of patient from Dr. Pollyann Savoy.  Please see his note for prior history, physical and care.  Briefly this is a 54 year old male who presented with concern for abdominal pain, nausea and vomiting.  History described is not consistent with symptomatic cholelithiasis.  Urinalysis is not consistent with urinary tract infection.  Discussed importance of close urology follow-up for bladder abnormalities seen on CT.  He was given prescription for Reglan by Dr. 57.  Bernette Mayers, LCSW was able to find transportation for patient back home.  Discussed importance of close outpatient follow up.      Eartha Inch, MD 07/17/20 [phone]

## 2020-07-16 NOTE — ED Notes (Signed)
Provider Schlossman MD notified of pt's HTN at 162/95. Awaiting orders.

## 2020-07-16 NOTE — ED Provider Notes (Signed)
[facility] EMERGENCY DEPARTMENT Provider Note  CSN: [id number] Arrival date & time: 07/16/20 1251    History Chief Complaint  Patient presents with  . Abdominal Pain  . Chest Pain    HPI  Jeremy Lang is a 54 y.o. male with history of poorly controlled DM moved to this area from [facility] a few months ago and has not yet established with PCP. He reports gradual onset of severe abdominal pain, reports pain is from mid chest to pelvis and wraps all the way around his whole torso, pain has been cramping/sharp and waxing/waning for the last 6 days. He has had multiple episodes of vomiting, pain is worse after eating. He reports diarrhea alternating with constipation. He did not seek medical care until symptoms worsened today. No prior history of similar although he has had a partial colectomy for diverticulitis and either a cholecystectomy or appendectomy, he can't remember which. Denies alcohol or drug use.    Past Medical History:  Diagnosis Date  . Diabetes mellitus without complication (HCC)   . Seizures (HCC)   . Stroke [facility])     No past surgical history on file.  No family history on file.  Social History   Tobacco Use  . Smoking status: Never Smoker  . Smokeless tobacco: Never Used  Substance Use Topics  . Alcohol use: Not Currently  . Drug use: Not Currently     Home Medications Prior to Admission medications   Medication Sig Start Date End Date Taking? Authorizing Provider  aspirin 81 MG EC tablet Take 81 mg by mouth daily. 02/20/20   [provider]  atorvastatin (LIPITOR) 80 MG tablet Take 80 mg by mouth daily. 02/20/20   [provider]  buPROPion (WELLBUTRIN SR) 150 MG 12 hr tablet Take 150 mg by mouth 2 (two) times daily. 02/20/20   [provider]  clopidogrel (PLAVIX) 75 MG tablet Take 75 mg by mouth daily. 02/20/20   [provider]  LEVEMIR FLEXTOUCH 100 UNIT/ML FlexPen Inject 20 Units into the skin in the morning  and at bedtime. 05/12/20   Albrizze, Kaitlyn E, PA-C  losartan (COZAAR) 100 MG tablet Take 100 mg by mouth daily. 02/20/20   [provider]  metoCLOPramide (REGLAN) 10 MG tablet Take 1 tablet (10 mg total) by mouth every 8 (eight) hours as needed for nausea or vomiting. 07/16/20   Pollyann Savoy, MD  NOVOLOG FLEXPEN 100 UNIT/ML FlexPen Inject 22 Units into the skin in the morning, at noon, in the evening, and at bedtime. 05/12/20   Albrizze, Kaitlyn E, PA-C  polyethylene glycol (MIRALAX / GLYCOLAX) 17 g packet Take 17 g by mouth daily.    [provider]     Allergies    Patient has no known allergies.   Review of Systems   Review of Systems A comprehensive review of systems was completed and negative except as noted in HPI.    Physical Exam BP (!) 181/104   Pulse 72   Temp 98.6 F (37 C) (Oral)   Resp (!) 23   Ht 5\' 7"  (1.702 m)   Wt 106.6 kg   SpO2 100%   BMI 36.81 kg/m   Physical Exam Vitals and nursing note reviewed.  Constitutional:      Appearance: Normal appearance.  HENT:     Head: Normocephalic and atraumatic.     Nose: Nose normal.     Mouth/Throat:     Mouth: Mucous membranes are moist.  Eyes:  Extraocular Movements: Extraocular movements intact.     Conjunctiva/sclera: Conjunctivae normal.  Cardiovascular:     Rate and Rhythm: Normal rate.  Pulmonary:     Effort: Pulmonary effort is normal.     Breath sounds: Normal breath sounds.  Abdominal:     General: Abdomen is flat. A surgical scar is present.     Palpations: Abdomen is soft.     Tenderness: There is generalized abdominal tenderness. There is no guarding. Negative signs include Murphy's sign and McBurney's sign.  Musculoskeletal:        General: No swelling. Normal range of motion.     Cervical back: Neck supple.  Skin:    General: Skin is warm and dry.  Neurological:     General: No focal deficit present.     Mental Status: He is alert.  Psychiatric:        Mood and  Affect: Mood normal.      ED Results / Procedures / Treatments   Labs (all labs ordered are listed, but only abnormal results are displayed) Labs Reviewed  BASIC METABOLIC PANEL - Abnormal; Notable for the following components:      Result Value   Sodium 134 (*)    Glucose, Bld 192 (*)    Calcium 8.5 (*)    All other components within normal limits  CBC  LIPASE, BLOOD  URINALYSIS, ROUTINE W REFLEX MICROSCOPIC  RAPID URINE DRUG SCREEN, HOSP PERFORMED  TROPONIN I (HIGH SENSITIVITY)  TROPONIN I (HIGH SENSITIVITY)    EKG EKG Interpretation  Date/Time:  Tuesday July 16 2020 13:15:54 EDT Ventricular Rate:  65 PR Interval:    QRS Duration: 87 QT Interval:  406 QTC Calculation: 423 R Axis:   34 Text Interpretation: Sinus rhythm Prolonged PR interval RSR' in V1 or V2, probably normal variant Consider anterior infarct No significant change since last tracing Confirmed by Susy Frizzle [phone]) on 07/16/2020 2:08:15 PM   Radiology DG Chest 2 View  Result Date: 07/16/2020 CLINICAL DATA:  Chest pain EXAM: CHEST - 2 VIEW COMPARISON:  None. FINDINGS: Lungs are clear. No pneumothorax or pleural effusion. Coronary artery bypass grafting has been performed. Cardiac size within normal limits. Pulmonary vascularity is normal. No acute bone abnormality. IMPRESSION: No active cardiopulmonary disease. Electronically Signed   By: Helyn Numbers MD   On: 07/16/2020 14:20   CT Abdomen Pelvis W Contrast  Result Date: 07/16/2020 CLINICAL DATA:  Abdominal pain with nausea for 6 days EXAM: CT ABDOMEN AND PELVIS WITH CONTRAST TECHNIQUE: Multidetector CT imaging of the abdomen and pelvis was performed using the standard protocol following bolus administration of intravenous contrast. CONTRAST:  OMNIPAQUE IOHEXOL 300 MG/ML  SOLN COMPARISON:  None. FINDINGS: Lower chest: No acute abnormality. Hepatobiliary: No focal liver abnormality is seen. Tiny stone is seen within the dependent gallbladder  near the level of the gallbladder neck. No gallbladder wall thickening or pericholecystic inflammatory changes by CT. No biliary dilatation. Pancreas: Unremarkable. No pancreatic ductal dilatation or surrounding inflammatory changes. Spleen: Normal in size without focal abnormality. Adrenals/Urinary Tract: Unremarkable adrenal glands. Horseshoe configuration of the kidneys. Symmetric renal enhancement without focal lesion, stone, or hydronephrosis. Bilateral ureters are unremarkable. There is diffuse urinary bladder wall thickening slightly asymmetrically involving the anterior wall and bladder dome. Stomach/Bowel: Small sliding type hiatal hernia. Stomach otherwise within normal limits. No dilated loops of bowel to suggest obstruction. No focal bowel wall thickening or inflammatory changes. Vascular/Lymphatic: Scattered aortoiliac atherosclerotic calcifications without aneurysm. There are a few mildly  prominent right external iliac chain lymph nodes, the largest measuring 10 mm short axis (series 2, image 78). Reproductive: Prostate is unremarkable. Other: No free fluid. No abdominopelvic fluid collection. No pneumoperitoneum. There is a ventral abdominal wall fat containing hernia. Evidence of prior midline ventral abdominal wall surgery. Musculoskeletal: No acute or significant osseous findings. IMPRESSION: 1. Diffuse urinary bladder wall thickening slightly asymmetrically involving the anterior wall and bladder dome. Findings may represent cystitis. Correlation with urinalysis is recommended. Given the asymmetry, direct visualization should be considered to exclude an underlying neoplasm. 2. Mildly prominent right external iliac chain lymph nodes, nonspecific and may be reactive. 3. Cholelithiasis without evidence of acute cholecystitis. 4. Small sliding type hiatal hernia. 5. Fat containing ventral abdominal wall hernia. 6. Horseshoe configuration of the kidneys without obstructive uropathy. 7. Aortic  atherosclerosis. (ICD10-I70.0). Electronically Signed   By: Duanne Guess D.O.   On: 07/16/2020 15:53    Procedures Procedures  Medications Ordered in the ED Medications  sodium chloride 0.9 % bolus 1,000 mL (1,000 mLs Intravenous New Bag/Given 07/16/20 1458)  fentaNYL (SUBLIMAZE) injection 50 mcg (50 mcg Intravenous Given 07/16/20 1453)  ondansetron (ZOFRAN) injection 4 mg (4 mg Intravenous Given 07/16/20 1453)  iohexol (OMNIPAQUE) 300 MG/ML solution 100 mL (100 mLs Intravenous Contrast Given 07/16/20 1523)     MDM Rules/Calculators/A&P MDM Patient with waxing and waning abdominal pain, prior open colectomy scar. Will check labs and CT for signs of SBO. Pain meds, IVF and antiemetics.  ED Course  I have reviewed the triage vital signs and the nursing notes.  Pertinent labs & imaging results that were available during my care of the patient were reviewed by me and considered in my medical decision making (see chart for details).  Clinical Course as of Jul 16 1610  Tue Jul 16, 2020  1428 Patient noted to be shaking and groaning during my history and exam but looking at his phone in no apparent discomfort while no one is in his room.    [CS]  1429 CBC is normal.    [CS]  1449 Lipase is normal.    [CS]  1508 BMP and Trop are normal.    [CS]  1537 Patient in no distress while being transported to CT.    [CS]  1557 CT images and results reviewed. Bladder wall abnormality mentioned to patient. Need UA here and Urology follow up as an outpatient.    [CS]  1608 UA is pending. Patient also may be having gastroparesis. Plan discharge with outpatient PCP follow up, referred to [facility] as he does not yet have a PCP due to his recent move. Rx for Reglan for continued symptom control. Abx if UA positive. Care signed out to Dr. Dalene Seltzer pending UA result.    [CS]    Clinical Course User Index [CS] Pollyann Savoy, MD    Final Clinical Impression(s) / ED  Diagnoses Final diagnoses:  Generalized abdominal pain  Gastroparesis  Abnormal CT scan, bladder    Rx / DC Orders ED Discharge Orders         Ordered    metoCLOPramide (REGLAN) 10 MG tablet  Every 8 hours PRN        07/16/20 1610           Pollyann Savoy, MD 07/16/20 1611

## 2020-07-16 NOTE — ED Triage Notes (Signed)
Pt bib ems from home following six days abd pain, bilateral flank pain, and radiating midsternal pain. No hx kidney stones.   EMS Vitals BG 219 140/84 HR 88 RR 16 97% RA

## 2020-07-16 NOTE — Progress Notes (Addendum)
Consult request for transport assistance has been received. CSW attempting to follow up at present time.  6:29 PM Pt states he lives at:  [address] [place], [place] [address]  CSW calling [employer] now.  CSW will continue to follow for D/C needs.  Dorothe Pea. Twan Harkin  MSW, LCSW, LCAS, CSI Transitions of Care Clinical Social Worker Care Coordination Department Ph: [phone]

## 2020-07-17 ENCOUNTER — Encounter (HOSPITAL_BASED_OUTPATIENT_CLINIC_OR_DEPARTMENT_OTHER): Payer: Self-pay | Admitting: Physician Assistant

## 2020-08-07 ENCOUNTER — Encounter (HOSPITAL_COMMUNITY): Payer: Self-pay | Admitting: Internal Medicine

## 2020-08-07 ENCOUNTER — Inpatient Hospital Stay (HOSPITAL_COMMUNITY)
Admission: EM | Admit: 2020-08-07 | Discharge: 2020-08-16 | DRG: 057 | Disposition: A | Discharge status: Skilled Nursing Facility | Payer: Medicaid Other | Attending: Internal Medicine | Admitting: Internal Medicine

## 2020-08-07 ENCOUNTER — Other Ambulatory Visit: Payer: Self-pay

## 2020-08-07 ENCOUNTER — Observation Stay (HOSPITAL_COMMUNITY): Payer: Medicaid Other

## 2020-08-07 ENCOUNTER — Emergency Department (HOSPITAL_COMMUNITY): Payer: Medicaid Other

## 2020-08-07 DIAGNOSIS — Z8249 Family history of ischemic heart disease and other diseases of the circulatory system: Secondary | ICD-10-CM

## 2020-08-07 DIAGNOSIS — Z79899 Other long term (current) drug therapy: Secondary | ICD-10-CM

## 2020-08-07 DIAGNOSIS — Z6836 Body mass index (BMI) 36.0-36.9, adult: Secondary | ICD-10-CM

## 2020-08-07 DIAGNOSIS — Z823 Family history of stroke: Secondary | ICD-10-CM

## 2020-08-07 DIAGNOSIS — F250 Schizoaffective disorder, bipolar type: Secondary | ICD-10-CM | POA: Diagnosis present

## 2020-08-07 DIAGNOSIS — IMO0002 Reserved for concepts with insufficient information to code with codable children: Secondary | ICD-10-CM

## 2020-08-07 DIAGNOSIS — H109 Unspecified conjunctivitis: Secondary | ICD-10-CM | POA: Diagnosis present

## 2020-08-07 DIAGNOSIS — Z23 Encounter for immunization: Secondary | ICD-10-CM

## 2020-08-07 DIAGNOSIS — E119 Type 2 diabetes mellitus without complications: Secondary | ICD-10-CM

## 2020-08-07 DIAGNOSIS — R569 Unspecified convulsions: Secondary | ICD-10-CM

## 2020-08-07 DIAGNOSIS — R471 Dysarthria and anarthria: Secondary | ICD-10-CM | POA: Diagnosis present

## 2020-08-07 DIAGNOSIS — I10 Essential (primary) hypertension: Secondary | ICD-10-CM | POA: Diagnosis present

## 2020-08-07 DIAGNOSIS — H538 Other visual disturbances: Secondary | ICD-10-CM | POA: Diagnosis present

## 2020-08-07 DIAGNOSIS — T1490XA Injury, unspecified, initial encounter: Secondary | ICD-10-CM

## 2020-08-07 DIAGNOSIS — E1140 Type 2 diabetes mellitus with diabetic neuropathy, unspecified: Secondary | ICD-10-CM | POA: Diagnosis present

## 2020-08-07 DIAGNOSIS — R531 Weakness: Secondary | ICD-10-CM

## 2020-08-07 DIAGNOSIS — R29706 NIHSS score 6: Secondary | ICD-10-CM | POA: Diagnosis present

## 2020-08-07 DIAGNOSIS — Z794 Long term (current) use of insulin: Secondary | ICD-10-CM

## 2020-08-07 DIAGNOSIS — H5710 Ocular pain, unspecified eye: Secondary | ICD-10-CM | POA: Diagnosis not present

## 2020-08-07 DIAGNOSIS — E1165 Type 2 diabetes mellitus with hyperglycemia: Secondary | ICD-10-CM | POA: Diagnosis present

## 2020-08-07 DIAGNOSIS — F17200 Nicotine dependence, unspecified, uncomplicated: Secondary | ICD-10-CM | POA: Diagnosis present

## 2020-08-07 DIAGNOSIS — E11621 Type 2 diabetes mellitus with foot ulcer: Secondary | ICD-10-CM | POA: Diagnosis present

## 2020-08-07 DIAGNOSIS — R4781 Slurred speech: Secondary | ICD-10-CM

## 2020-08-07 DIAGNOSIS — R29810 Facial weakness: Secondary | ICD-10-CM | POA: Diagnosis present

## 2020-08-07 DIAGNOSIS — Z951 Presence of aortocoronary bypass graft: Secondary | ICD-10-CM

## 2020-08-07 DIAGNOSIS — E669 Obesity, unspecified: Secondary | ICD-10-CM | POA: Diagnosis present

## 2020-08-07 DIAGNOSIS — F445 Conversion disorder with seizures or convulsions: Secondary | ICD-10-CM | POA: Diagnosis present

## 2020-08-07 DIAGNOSIS — G9389 Other specified disorders of brain: Secondary | ICD-10-CM | POA: Diagnosis present

## 2020-08-07 DIAGNOSIS — I2510 Atherosclerotic heart disease of native coronary artery without angina pectoris: Secondary | ICD-10-CM | POA: Diagnosis present

## 2020-08-07 DIAGNOSIS — L97512 Non-pressure chronic ulcer of other part of right foot with fat layer exposed: Secondary | ICD-10-CM

## 2020-08-07 DIAGNOSIS — Z8673 Personal history of transient ischemic attack (TIA), and cerebral infarction without residual deficits: Secondary | ICD-10-CM

## 2020-08-07 DIAGNOSIS — R4701 Aphasia: Secondary | ICD-10-CM | POA: Diagnosis present

## 2020-08-07 DIAGNOSIS — E871 Hypo-osmolality and hyponatremia: Secondary | ICD-10-CM | POA: Diagnosis not present

## 2020-08-07 DIAGNOSIS — G8191 Hemiplegia, unspecified affecting right dominant side: Principal | ICD-10-CM | POA: Diagnosis present

## 2020-08-07 DIAGNOSIS — Z20822 Contact with and (suspected) exposure to covid-19: Secondary | ICD-10-CM | POA: Diagnosis present

## 2020-08-07 DIAGNOSIS — Z833 Family history of diabetes mellitus: Secondary | ICD-10-CM

## 2020-08-07 LAB — I-STAT CHEM 8, ED
BUN: 15 mg/dL (ref 6–20)
Calcium, Ion: 1.08 mmol/L — ABNORMAL LOW (ref 1.15–1.40)
Chloride: 99 mmol/L (ref 98–111)
Creatinine, Ser: 1 mg/dL (ref 0.61–1.24)
Glucose, Bld: 68 mg/dL — ABNORMAL LOW (ref 70–99)
HCT: 47 % (ref 39.0–52.0)
Hemoglobin: 16 g/dL (ref 13.0–17.0)
Potassium: 3.7 mmol/L (ref 3.5–5.1)
Sodium: 137 mmol/L (ref 135–145)
TCO2: 24 mmol/L (ref 22–32)

## 2020-08-07 LAB — DIFFERENTIAL
Abs Immature Granulocytes: 0.04 10*3/uL (ref 0.00–0.07)
Basophils Absolute: 0.1 10*3/uL (ref 0.0–0.1)
Basophils Relative: 1 %
Eosinophils Absolute: 0.1 10*3/uL (ref 0.0–0.5)
Eosinophils Relative: 1 %
Immature Granulocytes: 0 %
Lymphocytes Relative: 27 %
Lymphs Abs: 2.8 10*3/uL (ref 0.7–4.0)
Monocytes Absolute: 0.7 10*3/uL (ref 0.1–1.0)
Monocytes Relative: 7 %
Neutro Abs: 6.6 10*3/uL (ref 1.7–7.7)
Neutrophils Relative %: 64 %

## 2020-08-07 LAB — APTT: aPTT: 25 seconds (ref 24–36)

## 2020-08-07 LAB — COMPREHENSIVE METABOLIC PANEL
ALT: 18 U/L (ref 0–44)
AST: 18 U/L (ref 15–41)
Albumin: 3.3 g/dL — ABNORMAL LOW (ref 3.5–5.0)
Alkaline Phosphatase: 84 U/L (ref 38–126)
Anion gap: 9 (ref 5–15)
BUN: 13 mg/dL (ref 6–20)
CO2: 25 mmol/L (ref 22–32)
Calcium: 8.9 mg/dL (ref 8.9–10.3)
Chloride: 101 mmol/L (ref 98–111)
Creatinine, Ser: 1.06 mg/dL (ref 0.61–1.24)
GFR, Estimated: >60 mL/min (ref >60)
Glucose, Bld: 72 mg/dL (ref 70–99)
Potassium: 3.8 mmol/L (ref 3.5–5.1)
Sodium: 135 mmol/L (ref 135–145)
Total Bilirubin: 1.1 mg/dL (ref 0.3–1.2)
Total Protein: 6.8 g/dL (ref 6.5–8.1)

## 2020-08-07 LAB — PROTIME-INR
INR: 0.9 (ref 0.8–1.2)
Prothrombin Time: 11.9 seconds (ref 11.4–15.2)

## 2020-08-07 LAB — RESP PANEL BY RT PCR (RSV, FLU A&B, COVID)
Influenza A by PCR: NEGATIVE
Influenza B by PCR: NEGATIVE
Respiratory Syncytial Virus by PCR: NEGATIVE
SARS Coronavirus 2 by RT PCR: NEGATIVE

## 2020-08-07 LAB — CBC
HCT: 47 % (ref 39.0–52.0)
Hemoglobin: 15.4 g/dL (ref 13.0–17.0)
MCH: 28.5 pg (ref 26.0–34.0)
MCHC: 32.8 g/dL (ref 30.0–36.0)
MCV: 86.9 fL (ref 80.0–100.0)
Platelets: 202 10*3/uL (ref 150–400)
RBC: 5.41 MIL/uL (ref 4.22–5.81)
RDW: 13.5 % (ref 11.5–15.5)
WBC: 10.4 10*3/uL (ref 4.0–10.5)
nRBC: 0 % (ref 0.0–0.2)

## 2020-08-07 LAB — CBG MONITORING, ED: Glucose-Capillary: 95 mg/dL (ref 70–99)

## 2020-08-07 MED ORDER — STROKE: EARLY STAGES OF RECOVERY BOOK
Freq: Once | Status: AC
  Filled 2020-08-07 (×2): qty 1

## 2020-08-07 MED ORDER — ACETAMINOPHEN 650 MG RE SUPP: 650.0000 mg | RECTAL | Status: DC | PRN

## 2020-08-07 MED ORDER — ACETAMINOPHEN 325 MG PO TABS
650.0000 mg | ORAL_TABLET | ORAL | Status: DC | PRN
  Administered 2020-08-08 – 2020-08-10 (×2): 650 mg via ORAL
  Filled 2020-08-07 (×2): qty 2

## 2020-08-07 MED ORDER — ENOXAPARIN SODIUM 40 MG/0.4ML ~~LOC~~ SOLN
40.0000 mg | SUBCUTANEOUS | Status: DC
  Administered 2020-08-08: 40 mg via SUBCUTANEOUS
  Filled 2020-08-07: qty 0.4

## 2020-08-07 MED ORDER — IOHEXOL 350 MG/ML SOLN
75.0000 mL | Freq: Once | INTRAVENOUS | Status: AC | PRN
  Administered 2020-08-07: 75 mL via INTRAVENOUS

## 2020-08-07 MED ORDER — LEVETIRACETAM IN NACL 1000 MG/100ML IV SOLN
1000.0000 mg | INTRAVENOUS | Status: DC
  Administered 2020-08-07: 1000 mg via INTRAVENOUS
  Filled 2020-08-07 (×2): qty 100

## 2020-08-07 MED ORDER — LEVETIRACETAM IN NACL 1000 MG/100ML IV SOLN: 1000.0000 mg | Freq: Once | INTRAVENOUS | Status: DC

## 2020-08-07 MED ORDER — SENNOSIDES-DOCUSATE SODIUM 8.6-50 MG PO TABS
1.0000 | ORAL_TABLET | Freq: Every evening | ORAL | Status: DC | PRN
  Administered 2020-08-11: 1 via ORAL
  Filled 2020-08-07: qty 1

## 2020-08-07 MED ORDER — LORAZEPAM 2 MG/ML IJ SOLN
2.0000 mg | Freq: Once | INTRAMUSCULAR | Status: AC
  Administered 2020-08-07: 2 mg via INTRAVENOUS

## 2020-08-07 MED ORDER — INSULIN ASPART 100 UNIT/ML ~~LOC~~ SOLN
0.0000 [IU] | Freq: Three times a day (TID) | SUBCUTANEOUS | Status: DC
  Administered 2020-08-08 (×2): 3 [IU] via SUBCUTANEOUS
  Administered 2020-08-08: 7 [IU] via SUBCUTANEOUS
  Administered 2020-08-09 (×2): 3 [IU] via SUBCUTANEOUS

## 2020-08-07 MED ORDER — SODIUM CHLORIDE 0.9% FLUSH
3.0000 mL | Freq: Once | INTRAVENOUS | Status: AC
  Administered 2020-08-07: 3 mL via INTRAVENOUS

## 2020-08-07 MED ORDER — ACETAMINOPHEN 160 MG/5ML PO SOLN: 650.0000 mg | ORAL | Status: DC | PRN

## 2020-08-07 NOTE — Progress Notes (Signed)
EEG complete - results pending 

## 2020-08-07 NOTE — H&P (Signed)
History and Physical    Jeremy ScarceMatthew Lang BJY:[id number] DOB: 10/04/1965 DOA: 08/07/2020  PCP: Patient, No Pcp Per  Patient coming from: Home  I have personally briefly reviewed patient's [facility]  Chief Complaint: Right-sided weakness  HPI: Jeremy ScarceMatthew Lang is a 54 y.o. male with medical history significant for type 2 diabetes, CAD s/p CABG, seizures, prior hemorrhagic lacunar infarct, schizoaffective disorder who presents to the ED for evaluation of acute right-sided weakness.  Patient states he was in his usual state of health until around 1750 when he had new onset of right upper and lower extremity weakness and difficulty speaking. He says he was walking from the kitchen when symptoms began. He says this is similar to his previous strokes therefore he came to the ED for further evaluation.  Patient states he recently moved here from [facility] several months ago. He says he is not currently taking any medications other than insulin. He says he does not currently have a PCP.  ED Course:  Initial vitals showed BP 141/87, pulse 76, RR 14, temp 98.8 Fahrenheit, SPO2 100% on room air.  Labs show sodium 135, potassium 3.8, bicarb 25, BUN 13, creatinine 1.06, serum glucose 72, LFTs within normal limits, WBC 10.4, hemoglobin 15.4, platelets 202,000.  SARS-CoV-2 PCR is negative.  Influenza A/B PCR's are negative.  CT head without contrast showed chronic small vessel disease in the left corona radiata and chronic left lingula encephalomalacia, stable compared to prior.  No acute cortically based infarct or acute intracranial hemorrhage seen.  CTA head/neck was negative for large vessel occlusion.  Neurology were consulted.  Patient had reported seizure-like activity while in the CT scanner.  He was given IV Keppra 1000 mg and IV Ativan 2 mg once each.  MRI brain and EEG were ordered per neurology.  The hospitalist service was consulted to admit for further  evaluation and management.  Review of Systems: All systems reviewed and are negative except as documented in history of present illness above.   Past Medical History:  Diagnosis Date  . Diabetes mellitus without complication (HCC)   . Seizures (HCC)   . Stroke [facility](HCC)     No past surgical history on file.  Social History:  reports that he has never smoked. He has never used smokeless tobacco. He reports previous alcohol use. He reports previous drug use.  No Known Allergies  No family history on file.   Prior to Admission medications   Medication Sig Start Date End Date Taking? Authorizing Provider  cholecalciferol (VITAMIN D3) 25 MCG (1000 UNIT) tablet Take 1,000 Units by mouth daily.   Yes [provider]  LEVEMIR FLEXTOUCH 100 UNIT/ML FlexPen Inject 20 Units into the skin in the morning and at bedtime. 05/12/20  Yes Walisiewicz, Kaitlyn E, PA-C  NOVOLOG FLEXPEN 100 UNIT/ML FlexPen Inject 22 Units into the skin in the morning, at noon, in the evening, and at bedtime. Patient taking differently: Inject 16-22 Units into the skin 3 (three) times daily as needed for high blood sugar.  05/12/20  Yes Walisiewicz, Kaitlyn E, PA-C  vitamin C (ASCORBIC ACID) 250 MG tablet Take 250 mg by mouth daily.   Yes [provider]  metoCLOPramide (REGLAN) 10 MG tablet Take 1 tablet (10 mg total) by mouth every 8 (eight) hours as needed for nausea or vomiting. Patient not taking: Reported on 08/07/2020 07/16/20   Pollyann SavoySheldon, Charles B, MD    Physical Exam: Vitals:   08/07/20 1931 08/07/20 1932  BP: (!) 141/87   Pulse: 76   Temp: 98.8 F (37.1 C)   TempSrc: Oral   SpO2: 100%   Weight:  106.6 kg  Height:  5\' 7"  (1.702 m)   Constitutional: Resting supine in bed with head slightly elevated, NAD, calm, comfortable Eyes: PERRL, lids and conjunctivae normal ENMT: Mucous membranes are moist. Posterior pharynx clear of any exudate or lesions.edentulous. Neck: normal, supple, no  masses. Respiratory: clear to auscultation bilaterally, no wheezing, no crackles. Normal respiratory effort. No accessory muscle use.  Cardiovascular: Regular rate and rhythm, no murmurs / rubs / gallops. No extremity edema. 2+ pedal pulses. Well-healed sternotomy scar. Abdomen: no tenderness. No hepatosplenomegaly. Bowel sounds positive.  Musculoskeletal: no clubbing / cyanosis. No joint deformity upper and lower extremities. Good ROM, no contractures. Normal muscle tone.  Skin: Circumferential ulcer at the plantar base of right first toe and right mid lateral foot without active drainage, bleeding, or surrounding erythema. Neurologic: Speech is slow but fluent, CN 2-12 grossly intact. Sensation intact, Strength 4/5 right upper and lower extremities otherwise 5/5 left upper and lower extremities. Psychiatric: Normal judgment and insight. Alert and oriented x 3. Normal mood.       Labs on Admission: I have personally reviewed following labs and imaging studies  CBC: Recent Labs  Lab 08/07/20 1857 08/07/20 1858  WBC 10.4  --   NEUTROABS 6.6  --   HGB 15.4 16.0  HCT 47.0 47.0  MCV 86.9  --   PLT 202  --    Basic Metabolic Panel: Recent Labs  Lab 08/07/20 1857 08/07/20 1858  NA 135 137  K 3.8 3.7  CL 101 99  CO2 25  --   GLUCOSE 72 68*  BUN 13 15  CREATININE 1.06 1.00  CALCIUM 8.9  --    GFR: Estimated Creatinine Clearance: 98.3 mL/min (by C-G formula based on SCr of 1 mg/dL). Liver Function Tests: Recent Labs  Lab 08/07/20 1857  AST 18  ALT 18  ALKPHOS 84  BILITOT 1.1  PROT 6.8  ALBUMIN 3.3*   No results for input(s): LIPASE, AMYLASE in the last 168 hours. No results for input(s): AMMONIA in the last 168 hours. Coagulation Profile: Recent Labs  Lab 08/07/20 1857  INR 0.9   Cardiac Enzymes: No results for input(s): CKTOTAL, CKMB, CKMBINDEX, TROPONINI in the last 168 hours. BNP (last 3 results) No results for input(s): PROBNP in the last 8760  hours. HbA1C: No results for input(s): HGBA1C in the last 72 hours. CBG: Recent Labs  Lab 08/07/20 1853  GLUCAP 95   Lipid Profile: No results for input(s): CHOL, HDL, LDLCALC, TRIG, CHOLHDL, LDLDIRECT in the last 72 hours. Thyroid Function Tests: No results for input(s): TSH, T4TOTAL, FREET4, T3FREE, THYROIDAB in the last 72 hours. Anemia Panel: No results for input(s): VITAMINB12, FOLATE, FERRITIN, TIBC, IRON, RETICCTPCT in the last 72 hours. Urine analysis:    Component Value Date/Time   COLORURINE YELLOW 07/16/2020 1621   APPEARANCEUR CLEAR 07/16/2020 1621   LABSPEC 1.023 07/16/2020 1621   PHURINE 5.0 07/16/2020 1621   GLUCOSEU >=500 (A) 07/16/2020 1621   HGBUR SMALL (A) 07/16/2020 1621   BILIRUBINUR NEGATIVE 07/16/2020 1621   KETONESUR NEGATIVE 07/16/2020 1621   PROTEINUR >=300 (A) 07/16/2020 1621   NITRITE NEGATIVE 07/16/2020 1621   LEUKOCYTESUR NEGATIVE 07/16/2020 1621    Radiological Exams on Admission: CT HEAD CODE STROKE WO CONTRAST  Result Date: 08/07/2020 CLINICAL DATA:  Code stroke. 54 year old male with right side weakness. EXAM:  CT HEAD WITHOUT CONTRAST TECHNIQUE: Contiguous axial images were obtained from the base of the skull through the vertex without intravenous contrast. COMPARISON:  Head CT 04/23/2020.  Brain MRI 04/24/2020. FINDINGS: Brain: Chronic lacunar infarct of the left corona radiata and lentiform appears stable since July. Stable gray-white matter differentiation elsewhere. Stable chronic encephalomalacia in the left amygdala. No midline shift, ventriculomegaly, mass effect, evidence of mass lesion, intracranial hemorrhage or evidence of cortically based acute infarction. Vascular: Calcified atherosclerosis at the skull base. No suspicious intracranial vascular hyperdensity. Skull: No acute osseous abnormality identified. Sinuses/Orbits: Increased fluid in the left maxillary sinus. Increased right maxillary mucosal thickening. Other visualized  paranasal sinuses and mastoids are stable and well pneumatized. Other: No acute orbit or scalp soft tissue finding. ASPECTS [facility] Stroke Program Early CT Score) Total score (0-10 with 10 being normal): 10 (chronic encephalomalacia in the left hemisphere). IMPRESSION: 1. Stable non contrast CT appearance of the brain since July, with chronic small vessel disease in the left corona radiata and chronic left amygdala encephalomalacia. 2. No acute cortically based infarct or acute intracranial hemorrhage identified. ASPECTS 10. 3. These results were communicated to Dr. Derry Lory at 7:08 pmon 11/10/2021by text page via the [facility] messaging system. Electronically Signed   By: Odessa Fleming M.D.   On: 08/07/2020 19:09   CT ANGIO HEAD CODE STROKE  Result Date: 08/07/2020 CLINICAL DATA:  54 year old male code stroke presentation. Right side weakness. EXAM: CT ANGIOGRAPHY HEAD AND NECK TECHNIQUE: Multidetector CT imaging of the head and neck was performed using the standard protocol during bolus administration of intravenous contrast. Multiplanar CT image reconstructions and MIPs were obtained to evaluate the vascular anatomy. Carotid stenosis measurements (when applicable) are obtained utilizing NASCET criteria, using the distal internal carotid diameter as the denominator. CONTRAST:  2mL OMNIPAQUE IOHEXOL 350 MG/ML SOLN COMPARISON:  Plain head CT 1858 hours today. Brain MRI and intracranial MRA 04/24/2020. FINDINGS: CTA NECK Skeleton: Prior sternotomy. Absent dentition. No acute osseous abnormality identified. Bilateral maxillary sinus disease again noted. Upper chest: Calcified coronary artery atherosclerosis. Visible central pulmonary arteries appear patent. Negative upper lungs. Other neck: Reflux of venous contrast throughout the right neck including in the right EJ and retromandibular vein to the level of the temporalis muscle. No other acute finding identified. Aortic arch: 4 vessel arch configuration, the left  vertebral arises directly from the arch. Mild arch atherosclerosis. Prior CABG. Right carotid system: Negative brachiocephalic artery and right CCA. Mild soft and calcified plaque at the right carotid bifurcation without stenosis. Mildly tortuous cervical right ICA. Left carotid system: Mild plaque in the left CCA proximal to the bifurcation without stenosis. Moderate soft and calcified plaque at the left ICA origin, but no stenosis. Vertebral arteries: Mild plaque in the proximal right subclavian artery without stenosis. Right vertebral artery origin is patent. The right V1 segment is obscured by surrounding venous contrast and streak artifact, but the visible right vertebral appears patent and normal to the skull base. The right vertebral artery is dominant. Non dominant left vertebral artery arises directly from the arch. The left vertebral remains diminutive but patent to the skull base. CTA HEAD Posterior circulation: Right vertebral supplies the basilar as on the July MRA. Normal right PICA origin. Left vertebral functionally terminates in PICA. No right vertebral or basilar artery stenosis. Patent SCA origins. Fetal type right PCA origin again noted. Normal left PCA origin with diminutive left posterior communicating artery and stable moderate left P1/P2 stenosis (series 10, image 121). Mild to moderate  right P2 and P3 segment irregularity and stenosis appears stable on series 11, image 20. Anterior circulation: Both ICA siphons are patent. Moderate calcified plaque on the left but no left siphon stenosis. Similar moderate plaque on the right with mild distal petrous stenosis only. Normal right posterior communicating artery origin. Normal ophthalmic artery origins. Patent carotid termini. Dominant left and diminutive or absent right ACA A1 segment with ectatic anterior communicating artery as before. The anterior communicating also appears mildly fenestrated. There is no discrete saccular aneurysm. ACAs  appears stable since July. Left MCA M1 remains patent with stable mild to moderate irregularity and stenosis on series 10, image 17. Left MCA trifurcation and left MCA branches are patent and appear stable. Right MCA M1 segment is stable with mild irregularity. Patent right MCA trifurcation. Right MCA branches are stable and within normal limits. Venous sinuses: Early contrast timing, not well evaluated. Anatomic variants: Dominant right vertebral artery supplies the basilar. Non dominant left vertebral arises from the arch and functionally terminates in PICA. Dominant left and diminutive or absent right ACA A1 segments. Review of the MIP images confirms the above findings IMPRESSION: 1. Negative for large vessel occlusion. 2. Stable since the July MRA, with intracranial atherosclerosis and moderate stenoses of the Left MCA M1, Left PCA P1/P2. 3. Cervical Carotid atherosclerosis without significant stenosis. 4. Prior CABG. These results were communicated to Dr. Erick Blinks at 7:38 pmon 11/10/2021by text page via the [facility] messaging system. Electronically Signed   By: Odessa Fleming M.D.   On: 08/07/2020 19:38   CT ANGIO NECK CODE STROKE  Result Date: 08/07/2020 CLINICAL DATA:  54 year old male code stroke presentation. Right side weakness. EXAM: CT ANGIOGRAPHY HEAD AND NECK TECHNIQUE: Multidetector CT imaging of the head and neck was performed using the standard protocol during bolus administration of intravenous contrast. Multiplanar CT image reconstructions and MIPs were obtained to evaluate the vascular anatomy. Carotid stenosis measurements (when applicable) are obtained utilizing NASCET criteria, using the distal internal carotid diameter as the denominator. CONTRAST:  75mL OMNIPAQUE IOHEXOL 350 MG/ML SOLN COMPARISON:  Plain head CT 1858 hours today. Brain MRI and intracranial MRA 04/24/2020. FINDINGS: CTA NECK Skeleton: Prior sternotomy. Absent dentition. No acute osseous abnormality identified. Bilateral  maxillary sinus disease again noted. Upper chest: Calcified coronary artery atherosclerosis. Visible central pulmonary arteries appear patent. Negative upper lungs. Other neck: Reflux of venous contrast throughout the right neck including in the right EJ and retromandibular vein to the level of the temporalis muscle. No other acute finding identified. Aortic arch: 4 vessel arch configuration, the left vertebral arises directly from the arch. Mild arch atherosclerosis. Prior CABG. Right carotid system: Negative brachiocephalic artery and right CCA. Mild soft and calcified plaque at the right carotid bifurcation without stenosis. Mildly tortuous cervical right ICA. Left carotid system: Mild plaque in the left CCA proximal to the bifurcation without stenosis. Moderate soft and calcified plaque at the left ICA origin, but no stenosis. Vertebral arteries: Mild plaque in the proximal right subclavian artery without stenosis. Right vertebral artery origin is patent. The right V1 segment is obscured by surrounding venous contrast and streak artifact, but the visible right vertebral appears patent and normal to the skull base. The right vertebral artery is dominant. Non dominant left vertebral artery arises directly from the arch. The left vertebral remains diminutive but patent to the skull base. CTA HEAD Posterior circulation: Right vertebral supplies the basilar as on the July MRA. Normal right PICA origin. Left vertebral functionally terminates in  PICA. No right vertebral or basilar artery stenosis. Patent SCA origins. Fetal type right PCA origin again noted. Normal left PCA origin with diminutive left posterior communicating artery and stable moderate left P1/P2 stenosis (series 10, image 121). Mild to moderate right P2 and P3 segment irregularity and stenosis appears stable on series 11, image 20. Anterior circulation: Both ICA siphons are patent. Moderate calcified plaque on the left but no left siphon stenosis.  Similar moderate plaque on the right with mild distal petrous stenosis only. Normal right posterior communicating artery origin. Normal ophthalmic artery origins. Patent carotid termini. Dominant left and diminutive or absent right ACA A1 segment with ectatic anterior communicating artery as before. The anterior communicating also appears mildly fenestrated. There is no discrete saccular aneurysm. ACAs appears stable since July. Left MCA M1 remains patent with stable mild to moderate irregularity and stenosis on series 10, image 17. Left MCA trifurcation and left MCA branches are patent and appear stable. Right MCA M1 segment is stable with mild irregularity. Patent right MCA trifurcation. Right MCA branches are stable and within normal limits. Venous sinuses: Early contrast timing, not well evaluated. Anatomic variants: Dominant right vertebral artery supplies the basilar. Non dominant left vertebral arises from the arch and functionally terminates in PICA. Dominant left and diminutive or absent right ACA A1 segments. Review of the MIP images confirms the above findings IMPRESSION: 1. Negative for large vessel occlusion. 2. Stable since the July MRA, with intracranial atherosclerosis and moderate stenoses of the Left MCA M1, Left PCA P1/P2. 3. Cervical Carotid atherosclerosis without significant stenosis. 4. Prior CABG. These results were communicated to Dr. Erick Blinks at 7:38 pmon 11/10/2021by text page via the [facility] messaging system. Electronically Signed   By: Odessa Fleming M.D.   On: 08/07/2020 19:38    EKG: Personally reviewed. Sinus rhythm without acute ischemic changes.  Assessment/Plan Principal Problem:   Acute right-sided weakness Active Problems:   Diabetes mellitus without complication (HCC)   Seizure-like activity (HCC)  Jeremy Lang is a 54 y.o. male with medical history significant for type 2 diabetes, CAD s/p CABG, seizures, prior hemorrhagic lacunar infarct, schizoaffective  disorder who is admitted for evaluation of acute right-sided weakness.  Acute right-sided weakness Hx of hemorrhagic lacunar infarct in the left basal ganglia/corona radiata: CT head and CTA head/neck without acute etiology. Neurology consulted and recommend further evaluation with MRI brain. Question of psychogenic component. -Follow MRI brain without contrast -Continue neurochecks, monitor on telemetry -PT/OT/SLP eval -Check A1c and lipid panel -Allow permissive hypertension for now pending MRI brain  Seizure-like activity: Patient had reported seizure-like activity in the ED while in the CT scanner. He received IV Keppra and Ativan. -Seizure precautions -Follow EEG  Type 2 diabetes: Check A1c. Place on sensitive SSI and adjust as needed.  CAD s/p CABG: Patient reports prior CABG in 2009. Records not currently available. Not currently on antiplatelet or statin therapy.  Diabetic ulcers of the right foot: Consult to wound care.  Schizoaffective disorder: Not currently taking medications for this. Needs to establish with PCP and behavioral health.  DVT prophylaxis: Lovenox Code Status: Full code Family Communication: Discussed with patient, he has discussed with family Disposition Plan: From home and likely discharge to home pending work-up for stroke, further neurology recommendations Consults called: Neurology Admission status:  Status is: Observation  The patient remains OBS appropriate and will d/c before 2 midnights.  Dispo: The patient is from: Home  Anticipated d/c is to: Home              Anticipated d/c date is: 1 day              Patient currently is not medically stable to d/c.   Darreld Mclean MD [facility]  If 7PM-7AM, please contact night-coverage [email or web address]  08/07/2020, 10:10 PM

## 2020-08-07 NOTE — ED Provider Notes (Signed)
[facility] EMERGENCY DEPARTMENT Provider Note   CSN: [id number] Arrival date & time: 08/07/20  1850     History Chief Complaint  Patient presents with  . Weakness    Jeremy Lang is a 54 y.o. male.  Pt presents to the ED today with right sided weakness.  Pt was playing video games with a friend and at 1750, he acutely became weak on the right and had some slurred speech.  Friend called EMS who called a code stroke.  Pt was met at the bridge by neurology and myself.  Pt went directly to the CT scanner.  He has had a prior hemorrhagic stroke and is not a candidate for tpa.  Per Epic review, pt has recently moved here from [place].  When he was seen in the ED in October, he said that he had not established with a pcp.  He is supposed to be on several meds.  It is unclear if he takes them.  In August, he was seen by SW who set him up with a pcp and with meds.  It looks like he did not follow up on those offers.        Past Medical History:  Diagnosis Date  . Diabetes mellitus without complication ([place])   . Seizures ([place])   . Stroke [facility])     Patient Active Problem List   Diagnosis Date Noted  . Acute right-sided weakness 08/07/2020    No past surgical history on file.     No family history on file.  Social History   Tobacco Use  . Smoking status: Never Smoker  . Smokeless tobacco: Never Used  Substance Use Topics  . Alcohol use: Not Currently  . Drug use: Not Currently    Home Medications Prior to Admission medications   Medication Sig Start Date End Date Taking? Authorizing Provider  cholecalciferol (VITAMIN D3) 25 MCG (1000 UNIT) tablet Take 1,000 Units by mouth daily.   Yes [provider]  LEVEMIR FLEXTOUCH 100 UNIT/ML FlexPen Inject 20 Units into the skin in the morning and at bedtime. 05/12/20  Yes Walisiewicz, Kaitlyn E, PA-C  NOVOLOG FLEXPEN 100 UNIT/ML FlexPen Inject 22 Units into the skin in the morning, at noon, in the  evening, and at bedtime. Patient taking differently: Inject 16-22 Units into the skin 3 (three) times daily as needed for high blood sugar.  05/12/20  Yes Walisiewicz, Kaitlyn E, PA-C  vitamin C (ASCORBIC ACID) 250 MG tablet Take 250 mg by mouth daily.   Yes [provider]  metoCLOPramide (REGLAN) 10 MG tablet Take 1 tablet (10 mg total) by mouth every 8 (eight) hours as needed for nausea or vomiting. Patient not taking: Reported on 08/07/2020 07/16/20   Truddie Hidden, MD    Allergies    Patient has no known allergies.  Review of Systems   Review of Systems  Neurological: Positive for speech difficulty and weakness.    Physical Exam Updated Vital Signs BP (!) 141/87 (BP Location: Left Arm)   Pulse 76   Temp 98.8 F (37.1 C) (Oral)   Ht _0  (1.702 m)   Wt 106.6 kg   SpO2 100%   BMI 36.81 kg/m   Physical Exam Vitals and nursing note reviewed.  Constitutional:      Appearance: He is obese.  HENT:     Head: Normocephalic and atraumatic.     Right Ear: External ear normal.     Left Ear: External ear normal.  Nose: Nose normal.     Mouth/Throat:     Mouth: Mucous membranes are moist.     Pharynx: Oropharynx is clear.  Eyes:     Extraocular Movements: Extraocular movements intact.     Conjunctiva/sclera: Conjunctivae normal.     Pupils: Pupils are equal, round, and reactive to light.  Cardiovascular:     Rate and Rhythm: Normal rate and regular rhythm.     Pulses: Normal pulses.     Heart sounds: Normal heart sounds.  Pulmonary:     Effort: Pulmonary effort is normal.     Breath sounds: Normal breath sounds.  Abdominal:     General: Abdomen is flat. Bowel sounds are normal.     Palpations: Abdomen is soft.  Musculoskeletal:        General: Normal range of motion.     Cervical back: Normal range of motion and neck supple.  Skin:    General: Skin is warm.     Capillary Refill: Capillary refill takes less than 2 seconds.  Neurological:     Mental  Status: He is alert.     Comments: Slurred speech.  Right sided weakness.     ED Results / Procedures / Treatments   Labs (all labs ordered are listed, but only abnormal results are displayed) Labs Reviewed  COMPREHENSIVE METABOLIC PANEL - Abnormal; Notable for the following components:      Result Value   Albumin 3.3 (*)    All other components within normal limits  I-STAT CHEM 8, ED - Abnormal; Notable for the following components:   Glucose, Bld 68 (*)    Calcium, Ion 1.08 (*)    All other components within normal limits  RESP PANEL BY RT PCR (RSV, FLU A&B, COVID)  PROTIME-INR  APTT  CBC  DIFFERENTIAL  LEVETIRACETAM LEVEL  CBG MONITORING, ED    EKG EKG Interpretation  Date/Time:  Wednesday August 07 2020 19:30:06 EST Ventricular Rate:  77 PR Interval:    QRS Duration: 92 QT Interval:  396 QTC Calculation: 449 R Axis:   27 Text Interpretation: Sinus rhythm Prolonged PR interval Low voltage, precordial leads No significant change since last tracing Confirmed by Isla Pence [id number]) on 08/07/2020 7:46:18 PM   Radiology CT HEAD CODE STROKE WO CONTRAST  Result Date: 08/07/2020 CLINICAL DATA:  Code stroke. 54 year old male with right side weakness. EXAM: CT HEAD WITHOUT CONTRAST TECHNIQUE: Contiguous axial images were obtained from the base of the skull through the vertex without intravenous contrast. COMPARISON:  Head CT 04/23/2020.  Brain MRI 04/24/2020. FINDINGS: Brain: Chronic lacunar infarct of the left corona radiata and lentiform appears stable since July. Stable gray-white matter differentiation elsewhere. Stable chronic encephalomalacia in the left amygdala. No midline shift, ventriculomegaly, mass effect, evidence of mass lesion, intracranial hemorrhage or evidence of cortically based acute infarction. Vascular: Calcified atherosclerosis at the skull base. No suspicious intracranial vascular hyperdensity. Skull: No acute osseous abnormality identified.  Sinuses/Orbits: Increased fluid in the left maxillary sinus. Increased right maxillary mucosal thickening. Other visualized paranasal sinuses and mastoids are stable and well pneumatized. Other: No acute orbit or scalp soft tissue finding. ASPECTS [facility] Stroke Program Early CT Score) Total score (0-10 with 10 being normal): 10 (chronic encephalomalacia in the left hemisphere). IMPRESSION: 1. Stable non contrast CT appearance of the brain since July, with chronic small vessel disease in the left corona radiata and chronic left amygdala encephalomalacia. 2. No acute cortically based infarct or acute intracranial hemorrhage identified. ASPECTS 10. 3. These  results were communicated to Dr. Lorrin Goodell at 7:08 pmon 11/10/2021by text page via the [facility] messaging system. Electronically Signed   By: Genevie Ann M.D.   On: 08/07/2020 19:09   CT ANGIO HEAD CODE STROKE  Result Date: 08/07/2020 CLINICAL DATA:  54 year old male code stroke presentation. Right side weakness. EXAM: CT ANGIOGRAPHY HEAD AND NECK TECHNIQUE: Multidetector CT imaging of the head and neck was performed using the standard protocol during bolus administration of intravenous contrast. Multiplanar CT image reconstructions and MIPs were obtained to evaluate the vascular anatomy. Carotid stenosis measurements (when applicable) are obtained utilizing NASCET criteria, using the distal internal carotid diameter as the denominator. CONTRAST:  46m OMNIPAQUE IOHEXOL 350 MG/ML SOLN COMPARISON:  Plain head CT 1858 hours today. Brain MRI and intracranial MRA 04/24/2020. FINDINGS: CTA NECK Skeleton: Prior sternotomy. Absent dentition. No acute osseous abnormality identified. Bilateral maxillary sinus disease again noted. Upper chest: Calcified coronary artery atherosclerosis. Visible central pulmonary arteries appear patent. Negative upper lungs. Other neck: Reflux of venous contrast throughout the right neck including in the right EJ and retromandibular vein to  the level of the temporalis muscle. No other acute finding identified. Aortic arch: 4 vessel arch configuration, the left vertebral arises directly from the arch. Mild arch atherosclerosis. Prior CABG. Right carotid system: Negative brachiocephalic artery and right CCA. Mild soft and calcified plaque at the right carotid bifurcation without stenosis. Mildly tortuous cervical right ICA. Left carotid system: Mild plaque in the left CCA proximal to the bifurcation without stenosis. Moderate soft and calcified plaque at the left ICA origin, but no stenosis. Vertebral arteries: Mild plaque in the proximal right subclavian artery without stenosis. Right vertebral artery origin is patent. The right V1 segment is obscured by surrounding venous contrast and streak artifact, but the visible right vertebral appears patent and normal to the skull base. The right vertebral artery is dominant. Non dominant left vertebral artery arises directly from the arch. The left vertebral remains diminutive but patent to the skull base. CTA HEAD Posterior circulation: Right vertebral supplies the basilar as on the July MRA. Normal right PICA origin. Left vertebral functionally terminates in PICA. No right vertebral or basilar artery stenosis. Patent SCA origins. Fetal type right PCA origin again noted. Normal left PCA origin with diminutive left posterior communicating artery and stable moderate left P1/P2 stenosis (series 10, image 121). Mild to moderate right P2 and P3 segment irregularity and stenosis appears stable on series 11, image 20. Anterior circulation: Both ICA siphons are patent. Moderate calcified plaque on the left but no left siphon stenosis. Similar moderate plaque on the right with mild distal petrous stenosis only. Normal right posterior communicating artery origin. Normal ophthalmic artery origins. Patent carotid termini. Dominant left and diminutive or absent right ACA A1 segment with ectatic anterior communicating  artery as before. The anterior communicating also appears mildly fenestrated. There is no discrete saccular aneurysm. ACAs appears stable since July. Left MCA M1 remains patent with stable mild to moderate irregularity and stenosis on series 10, image 17. Left MCA trifurcation and left MCA branches are patent and appear stable. Right MCA M1 segment is stable with mild irregularity. Patent right MCA trifurcation. Right MCA branches are stable and within normal limits. Venous sinuses: Early contrast timing, not well evaluated. Anatomic variants: Dominant right vertebral artery supplies the basilar. Non dominant left vertebral arises from the arch and functionally terminates in PICA. Dominant left and diminutive or absent right ACA A1 segments. Review of the MIP images confirms the  above findings IMPRESSION: 1. Negative for large vessel occlusion. 2. Stable since the July MRA, with intracranial atherosclerosis and moderate stenoses of the Left MCA M1, Left PCA P1/P2. 3. Cervical Carotid atherosclerosis without significant stenosis. 4. Prior CABG. These results were communicated to Dr. Donnetta Simpers at 7:38 pmon 11/10/2021by text page via the [facility] messaging system. Electronically Signed   By: Genevie Ann M.D.   On: 08/07/2020 19:38   CT ANGIO NECK CODE STROKE  Result Date: 08/07/2020 CLINICAL DATA:  54 year old male code stroke presentation. Right side weakness. EXAM: CT ANGIOGRAPHY HEAD AND NECK TECHNIQUE: Multidetector CT imaging of the head and neck was performed using the standard protocol during bolus administration of intravenous contrast. Multiplanar CT image reconstructions and MIPs were obtained to evaluate the vascular anatomy. Carotid stenosis measurements (when applicable) are obtained utilizing NASCET criteria, using the distal internal carotid diameter as the denominator. CONTRAST:  69m OMNIPAQUE IOHEXOL 350 MG/ML SOLN COMPARISON:  Plain head CT 1858 hours today. Brain MRI and intracranial MRA  04/24/2020. FINDINGS: CTA NECK Skeleton: Prior sternotomy. Absent dentition. No acute osseous abnormality identified. Bilateral maxillary sinus disease again noted. Upper chest: Calcified coronary artery atherosclerosis. Visible central pulmonary arteries appear patent. Negative upper lungs. Other neck: Reflux of venous contrast throughout the right neck including in the right EJ and retromandibular vein to the level of the temporalis muscle. No other acute finding identified. Aortic arch: 4 vessel arch configuration, the left vertebral arises directly from the arch. Mild arch atherosclerosis. Prior CABG. Right carotid system: Negative brachiocephalic artery and right CCA. Mild soft and calcified plaque at the right carotid bifurcation without stenosis. Mildly tortuous cervical right ICA. Left carotid system: Mild plaque in the left CCA proximal to the bifurcation without stenosis. Moderate soft and calcified plaque at the left ICA origin, but no stenosis. Vertebral arteries: Mild plaque in the proximal right subclavian artery without stenosis. Right vertebral artery origin is patent. The right V1 segment is obscured by surrounding venous contrast and streak artifact, but the visible right vertebral appears patent and normal to the skull base. The right vertebral artery is dominant. Non dominant left vertebral artery arises directly from the arch. The left vertebral remains diminutive but patent to the skull base. CTA HEAD Posterior circulation: Right vertebral supplies the basilar as on the July MRA. Normal right PICA origin. Left vertebral functionally terminates in PICA. No right vertebral or basilar artery stenosis. Patent SCA origins. Fetal type right PCA origin again noted. Normal left PCA origin with diminutive left posterior communicating artery and stable moderate left P1/P2 stenosis (series 10, image 121). Mild to moderate right P2 and P3 segment irregularity and stenosis appears stable on series 11, image  20. Anterior circulation: Both ICA siphons are patent. Moderate calcified plaque on the left but no left siphon stenosis. Similar moderate plaque on the right with mild distal petrous stenosis only. Normal right posterior communicating artery origin. Normal ophthalmic artery origins. Patent carotid termini. Dominant left and diminutive or absent right ACA A1 segment with ectatic anterior communicating artery as before. The anterior communicating also appears mildly fenestrated. There is no discrete saccular aneurysm. ACAs appears stable since July. Left MCA M1 remains patent with stable mild to moderate irregularity and stenosis on series 10, image 17. Left MCA trifurcation and left MCA branches are patent and appear stable. Right MCA M1 segment is stable with mild irregularity. Patent right MCA trifurcation. Right MCA branches are stable and within normal limits. Venous sinuses: Early contrast timing, not  well evaluated. Anatomic variants: Dominant right vertebral artery supplies the basilar. Non dominant left vertebral arises from the arch and functionally terminates in PICA. Dominant left and diminutive or absent right ACA A1 segments. Review of the MIP images confirms the above findings IMPRESSION: 1. Negative for large vessel occlusion. 2. Stable since the July MRA, with intracranial atherosclerosis and moderate stenoses of the Left MCA M1, Left PCA P1/P2. 3. Cervical Carotid atherosclerosis without significant stenosis. 4. Prior CABG. These results were communicated to Dr. Donnetta Simpers at 7:38 pmon 11/10/2021by text page via the [facility] messaging system. Electronically Signed   By: Genevie Ann M.D.   On: 08/07/2020 19:38    Procedures Procedures (including critical care time)  Medications Ordered in ED Medications  levETIRAcetam (KEPPRA) IVPB 1000 mg/100 mL premix (0 mg Intravenous Hold 08/07/20 1938)  sodium chloride flush (NS) 0.9 % injection 3 mL (3 mLs Intravenous Given 08/07/20 1953)  LORazepam  (ATIVAN) injection 2 mg (2 mg Intravenous Given 08/07/20 1912)  iohexol (OMNIPAQUE) 350 MG/ML injection 75 mL (75 mLs Intravenous Contrast Given 08/07/20 1921)    ED Course  I have reviewed the triage vital signs and the nursing notes.  Pertinent labs & imaging results that were available during my care of the patient were reviewed by me and considered in my medical decision making (see chart for details).    MDM Rules/Calculators/A&P                          Pt had a seizure while in the CT scanner.  He was given keppra and ativan IV.  Neurology ordered an EEG and a MRI.   Admit to medicine for stroke work up.  Pt d/w Dr. Posey Pronto (triad) for admission.  CRITICAL CARE Performed by: Isla Pence   Total critical care time: 30 minutes  Critical care time was exclusive of separately billable procedures and treating other patients.  Critical care was necessary to treat or prevent imminent or life-threatening deterioration.  Critical care was time spent personally by me on the following activities: development of treatment plan with patient and/or surrogate as well as nursing, discussions with consultants, evaluation of patient's response to treatment, examination of patient, obtaining history from patient or surrogate, ordering and performing treatments and interventions, ordering and review of laboratory studies, ordering and review of radiographic studies, pulse oximetry and re-evaluation of patient's condition. Final Clinical Impression(s) / ED Diagnoses Final diagnoses:  Weakness  Slurred speech  Seizure [facility])    Rx / DC Orders ED Discharge Orders    None       Isla Pence, MD 08/07/20 2151

## 2020-08-07 NOTE — Consult Note (Addendum)
NEUROLOGY CONSULTATION NOTE   Date of service: August 07, 2020 Patient Name: Jeremy Lang MRN:  [id number] DOB:  Dec 28, 1965 Reason for consult: "Stroke code"  History of Present Illness  Jeremy Lang is a 54 y.o. 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  Patient was at home with his friend watching video on his phone when the friend passed him the controller for the TV to have him watch a movie and noted that patient's speech did not make sense. He immediately called 911. He was noted to have R sided flaccid with a facial droop and he was brought in as a stroke code.  In the CT scanner, patient had a brief seizure like activity with predominantly shaking of the LUE and LLE with no head shaking and eye closed. I caught the end of the activity and noted just a few seconds of it before it spontaneously resolved. I noted that had slurred speech afterwards and minimal post ictal period.  He also endorses BL chronic blurred vision and expressed frustration with having the insurance help out with his glucometer.  He endorses that he smokes, has a family hx of stroke.  LKW: 08/07/20 at 1750. MRS: 1 NIHSS: 6 TPA: No, prior hemorrhagic infarct Thrombectomy: Not a candidate, no LVO    ROS   Constitutional Denies weight loss, fever and chills.  HEENT Endorses blurred vision, no problems with hearing.  Respiratory Denies SOB and cough.  CV Denies palpitations and CP  GI Denies abdominal pain, nausea, vomiting and diarrhea.  GU Denies dysuria and urinary frequency.  MSK Denies myalgia and joint pain.  Skin Denies rash and pruritus.  Neurological Denies headache and syncope.  Psychiatric Endorses depression but denies  any SI   Past History   Past Medical History:  Diagnosis Date  . Diabetes mellitus without complication (HCC)   . Seizures (HCC)   . Stroke [facility])    No past surgical history on file. No family history on file. Social History   Socioeconomic History  . Marital status: Widowed    Spouse name: Not on file  . Number of children: Not on file  . Years of education: Not on file  . Highest education level: Not on file  Occupational History  . Not on file  Tobacco Use  . Smoking status: Never Smoker  . Smokeless tobacco: Never Used  Substance and Sexual Activity  . Alcohol use: Not Currently  . Drug use: Not Currently  . Sexual activity: Not on file  Other Topics Concern  . Not on file  Social History Narrative  . Not on file   Social Determinants of Health   Financial Resource Strain:   . Difficulty of Paying Living Expenses: Not on file  Food Insecurity:   . Worried About Programme researcher, broadcasting/film/video in the Last Year: Not on file  . Ran Out of Food in the Last Year: Not on file  Transportation Needs:   . Lack of Transportation (Medical): Not on file  . Lack of Transportation (Non-Medical): Not on file  Physical Activity:   . Days of Exercise per Week: Not on file  . Minutes of Exercise per Session: Not on file  Stress:   . Feeling of Stress : Not on file  Social Connections:   . Frequency of Communication with Friends and Family: Not on file  . Frequency of Social Gatherings with Friends and Family: Not on file  . Attends Religious Services: Not on file  . Active Member of Clubs or Organizations: Not on file  . Attends Banker Meetings: Not on file  . Marital Status: Not on file   No Known Allergies  Medications  (Not in a hospital admission)    Vitals   There were no vitals filed for this visit.   There is no height or weight on file to calculate BMI.  Physical Exam   General: Laying comfortably in bed; in no acute distress. HENT: Normal  oropharynx and mucosa. Normal external appearance of ears and nose. Neck: Supple, no pain or tenderness CV: No JVD. No peripheral edema. Pulmonary: Symmetric Chest rise. Normal respiratory effort.  Abdomen: Soft to touch, non-tender.  Ext: R foot wound. No cyanosis, edema. Skin: No rash. Normal palpation of skin.   Musculoskeletal: Normal digits and nails by inspection. No clubbing.   Neurologic Examination  Mental status/Cognition: Alert, oriented to self, place, month and year, good attention. Speech/language: Intermittent dysarthria, Fluent, comprehension intact, object naming intact, repetition intact. Cranial nerves:   CN II Pupils equal and reactive to light, has tunneling of vision BL.   CN III,IV,VI EOM intact, no gaze preference or deviation, no nystagmus   CN V normal sensation in V1, V2, and V3 segments bilaterally   CN VII Mild R facial droop that resolves when he is talking   CN VIII normal hearing to speech   CN IX & X normal palatal elevation, no uvular deviation   CN XI 5/5 head turn and 5/5 shoulder shrug bilaterally   CN XII midline tongue protrusion   Motor:  Muscle bulk: normal, tone normal, pronator drift RUE tremor none Mvmt Root Nerve  Muscle Right Left Comments  SA C5/6 Ax Deltoid 3 5   EF C5/6 Mc Biceps 3 5   EE C6/7/8 Rad Triceps 3 5   WF C6/7 Med FCR 3 5   WE C7/8 PIN ECU 3 5   F Ab C8/T1 U ADM/FDI 3 5   HF L1/2/3 Fem Illopsoas 3 5   KE L2/3/4 Fem Quad 3 5   DF L4/5 D Peron Tib Ant 3 5   PF S1/2 Tibial Grc/Sol 3 5    Reflexes:  Right Left Comments  Pectoralis      Biceps (C5/6) 1 1   Brachioradialis (C5/6) 1 1    Triceps (C6/7) 1 1    Patellar (L3/4) 1 1    Achilles (S1) 1 1    Hoffman      Plantar     Jaw jerk    Sensation:  Light touch Decreased in RUE and RLE.   Pin prick    Temperature    Vibration   Proprioception    Coordination/Complex Motor:  - Finger to Nose intact on the left, no ataxia out of proportion to weakness on the  Right. - Heel to shin unable to get him to follow the command. - Rapid alternating movement are slowed BL - Gait: deferred given his RLE weakness and risk for falls.  Labs   CBC:  Recent Labs  Lab 08/07/20 1857 08/07/20 1858  WBC 10.4  --   NEUTROABS 6.6  --   HGB 15.4 16.0  HCT 47.0 47.0  MCV 86.9  --   PLT 202  --  Basic Metabolic Panel:  Lab Results  Component Value Date   NA 137 08/07/2020   K 3.7 08/07/2020   CO2 26 07/16/2020   GLUCOSE 68 (L) 08/07/2020   BUN 15 08/07/2020   CREATININE 1.00 08/07/2020   CALCIUM 8.5 (L) 07/16/2020   GFRNONAA >60 07/16/2020   GFRAA >60 05/27/2020   Lipid Panel: No results found for: LDLCALC HgbA1c: No results found for: HGBA1C Urine Drug Screen:     Component Value Date/Time   LABOPIA NONE DETECTED 07/16/2020 1621   COCAINSCRNUR NONE DETECTED 07/16/2020 1621   LABBENZ NONE DETECTED 07/16/2020 1621   AMPHETMU NONE DETECTED 07/16/2020 1621   THCU NONE DETECTED 07/16/2020 1621   LABBARB NONE DETECTED 07/16/2020 1621    Alcohol Level     Component Value Date/Time   ETH <10 04/23/2020 2219     Results for orders placed during the hospital encounter of 08/07/20  CT HEAD CODE STROKE WO CONTRAST IMPRESSION: 1. Stable non contrast CT appearance of the brain since July, with chronic small vessel disease in the left corona radiata and chronic left amygdala encephalomalacia. 2. No acute cortically based infarct or acute intracranial hemorrhage identified. ASPECTS 10. 3. These results were communicated to Dr. Derry Lory at 7:08 pmon 11/10/2021by text page via the [facility] messaging system.  CT Angio head and Neck: IMPRESSION: 1. Negative for large vessel occlusion. 2. Stable since the July MRA, with intracranial atherosclerosis and moderate stenoses of the Left MCA M1, Left PCA P1/P2. 3. Cervical Carotid atherosclerosis without significant stenosis. 4. Prior CABG.   Impression   Jeremy Lang is a 54 y.o. male with  PMH significant for Diabetes, Seizures (taken off Keppra he reports and last event was years ago), and priror hx of hemorrhagic alacunar infarct in the left basal ganglia/corona radiata, afibb unclear if on anticoagulation, hx of schizoaffective disorder who presents with acute onset R sided weakness with intermittent aphasia and a ?seizure like episode that spontaneously resolved with minimal post ictal period. His neurologic examination is notable for waxing and waning R facial droop and RUE and RLE weakness that too seems to be fluctuating.  CTH with no obvious large territory stroke. CTA with no LVO. I am not sure what to make of the episode of shaking as I only witnessed the end of it but it seemed to be clinically more consistent with a PNES. The weakness seems to come and go and there definitely seems to be a component where he gives away when testing full strength in the RUE and RLE. His facial droop and slurring goes away completely when he is engaged in a conversation. However, he does have stroke risk factors including poorly controlled diabetes, prior strokes, HTN. We will get an MRI Brain to evaluate for a stroke. But my exam suggests that there may be a psychogenic component to this too.  Recommendations  - MRI Brain without contrast - rEEG ______________________________________________________________________   Thank you for the opportunity to take part in the care of this patient. If you have any further questions, please contact the neurology consultation attending.  Signed,  Jeremy Lang [facility] Pager Number [phone]

## 2020-08-07 NOTE — ED Triage Notes (Signed)
Pt to ED today for stroke like symptoms.   Pt was playing video games with friends when at 1750 pt developed slurred speech and right sided weakness

## 2020-08-07 NOTE — ED Notes (Signed)
Colan Neptune [phone]

## 2020-08-08 ENCOUNTER — Telehealth: Payer: Self-pay | Admitting: Podiatry

## 2020-08-08 ENCOUNTER — Inpatient Hospital Stay (HOSPITAL_COMMUNITY): Payer: Medicaid Other

## 2020-08-08 DIAGNOSIS — R4701 Aphasia: Secondary | ICD-10-CM | POA: Diagnosis present

## 2020-08-08 DIAGNOSIS — E11621 Type 2 diabetes mellitus with foot ulcer: Secondary | ICD-10-CM | POA: Diagnosis present

## 2020-08-08 DIAGNOSIS — E1140 Type 2 diabetes mellitus with diabetic neuropathy, unspecified: Secondary | ICD-10-CM

## 2020-08-08 DIAGNOSIS — R471 Dysarthria and anarthria: Secondary | ICD-10-CM | POA: Diagnosis present

## 2020-08-08 DIAGNOSIS — E1165 Type 2 diabetes mellitus with hyperglycemia: Secondary | ICD-10-CM | POA: Diagnosis not present

## 2020-08-08 DIAGNOSIS — E871 Hypo-osmolality and hyponatremia: Secondary | ICD-10-CM | POA: Diagnosis not present

## 2020-08-08 DIAGNOSIS — L97512 Non-pressure chronic ulcer of other part of right foot with fat layer exposed: Secondary | ICD-10-CM | POA: Diagnosis present

## 2020-08-08 DIAGNOSIS — F250 Schizoaffective disorder, bipolar type: Secondary | ICD-10-CM | POA: Diagnosis present

## 2020-08-08 DIAGNOSIS — Z951 Presence of aortocoronary bypass graft: Secondary | ICD-10-CM | POA: Diagnosis not present

## 2020-08-08 DIAGNOSIS — IMO0002 Reserved for concepts with insufficient information to code with codable children: Secondary | ICD-10-CM

## 2020-08-08 DIAGNOSIS — G8191 Hemiplegia, unspecified affecting right dominant side: Secondary | ICD-10-CM | POA: Diagnosis present

## 2020-08-08 DIAGNOSIS — Z6836 Body mass index (BMI) 36.0-36.9, adult: Secondary | ICD-10-CM | POA: Diagnosis not present

## 2020-08-08 DIAGNOSIS — E119 Type 2 diabetes mellitus without complications: Secondary | ICD-10-CM | POA: Diagnosis not present

## 2020-08-08 DIAGNOSIS — E669 Obesity, unspecified: Secondary | ICD-10-CM | POA: Diagnosis present

## 2020-08-08 DIAGNOSIS — R531 Weakness: Secondary | ICD-10-CM | POA: Diagnosis not present

## 2020-08-08 DIAGNOSIS — Z8249 Family history of ischemic heart disease and other diseases of the circulatory system: Secondary | ICD-10-CM | POA: Diagnosis not present

## 2020-08-08 DIAGNOSIS — Z20822 Contact with and (suspected) exposure to covid-19: Secondary | ICD-10-CM | POA: Diagnosis present

## 2020-08-08 DIAGNOSIS — R4781 Slurred speech: Secondary | ICD-10-CM | POA: Diagnosis present

## 2020-08-08 DIAGNOSIS — Z833 Family history of diabetes mellitus: Secondary | ICD-10-CM | POA: Diagnosis not present

## 2020-08-08 DIAGNOSIS — Z23 Encounter for immunization: Secondary | ICD-10-CM | POA: Diagnosis not present

## 2020-08-08 DIAGNOSIS — Z823 Family history of stroke: Secondary | ICD-10-CM | POA: Diagnosis not present

## 2020-08-08 DIAGNOSIS — Z79899 Other long term (current) drug therapy: Secondary | ICD-10-CM | POA: Diagnosis not present

## 2020-08-08 DIAGNOSIS — F17200 Nicotine dependence, unspecified, uncomplicated: Secondary | ICD-10-CM | POA: Diagnosis present

## 2020-08-08 DIAGNOSIS — R569 Unspecified convulsions: Secondary | ICD-10-CM | POA: Diagnosis not present

## 2020-08-08 DIAGNOSIS — Z8673 Personal history of transient ischemic attack (TIA), and cerebral infarction without residual deficits: Secondary | ICD-10-CM | POA: Diagnosis not present

## 2020-08-08 DIAGNOSIS — I10 Essential (primary) hypertension: Secondary | ICD-10-CM | POA: Diagnosis present

## 2020-08-08 DIAGNOSIS — R29810 Facial weakness: Secondary | ICD-10-CM | POA: Diagnosis present

## 2020-08-08 DIAGNOSIS — R29706 NIHSS score 6: Secondary | ICD-10-CM | POA: Diagnosis present

## 2020-08-08 DIAGNOSIS — H538 Other visual disturbances: Secondary | ICD-10-CM | POA: Diagnosis present

## 2020-08-08 LAB — LIPID PANEL
Cholesterol: 224 mg/dL — ABNORMAL HIGH (ref 0–200)
HDL: 54 mg/dL (ref >40)
LDL Cholesterol: 147 mg/dL — ABNORMAL HIGH (ref 0–99)
Total CHOL/HDL Ratio: 4.1 RATIO
Triglycerides: 114 mg/dL (ref <150)
VLDL: 23 mg/dL (ref 0–40)

## 2020-08-08 LAB — RAPID URINE DRUG SCREEN, HOSP PERFORMED
Amphetamines: NOT DETECTED
Barbiturates: NOT DETECTED
Benzodiazepines: NOT DETECTED
Cocaine: NOT DETECTED
Opiates: NOT DETECTED
Tetrahydrocannabinol: NOT DETECTED

## 2020-08-08 LAB — URINALYSIS, ROUTINE W REFLEX MICROSCOPIC
Bilirubin Urine: NEGATIVE
Glucose, UA: >=500 mg/dL — AB
Ketones, ur: NEGATIVE mg/dL
Leukocytes,Ua: NEGATIVE
Nitrite: NEGATIVE
Protein, ur: >=300 mg/dL — AB
Specific Gravity, Urine: 1.044 — ABNORMAL HIGH (ref 1.005–1.030)
pH: 5 (ref 5.0–8.0)

## 2020-08-08 LAB — GLUCOSE, CAPILLARY
Glucose-Capillary: 209 mg/dL — ABNORMAL HIGH (ref 70–99)
Glucose-Capillary: 221 mg/dL — ABNORMAL HIGH (ref 70–99)
Glucose-Capillary: 306 mg/dL — ABNORMAL HIGH (ref 70–99)
Glucose-Capillary: 266 mg/dL — ABNORMAL HIGH (ref 70–99)

## 2020-08-08 LAB — HIV ANTIBODY (ROUTINE TESTING W REFLEX): HIV Screen 4th Generation wRfx: NONREACTIVE

## 2020-08-08 LAB — HEMOGLOBIN A1C
Hgb A1c MFr Bld: 8.2 % — ABNORMAL HIGH (ref 4.8–5.6)
Mean Plasma Glucose: 188.64 mg/dL

## 2020-08-08 MED ORDER — PNEUMOCOCCAL VAC POLYVALENT 25 MCG/0.5ML IJ INJ
0.5000 mL | INJECTION | INTRAMUSCULAR | Status: AC
  Administered 2020-08-09: 0.5 mL via INTRAMUSCULAR
  Filled 2020-08-08: qty 0.5

## 2020-08-08 MED ORDER — INSULIN GLARGINE 100 UNIT/ML ~~LOC~~ SOLN
5.0000 [IU] | Freq: Every day | SUBCUTANEOUS | Status: DC
  Administered 2020-08-08: 5 [IU] via SUBCUTANEOUS
  Filled 2020-08-08 (×3): qty 0.05

## 2020-08-08 MED ORDER — MUPIROCIN 2 % EX OINT
TOPICAL_OINTMENT | Freq: Two times a day (BID) | CUTANEOUS | Status: DC
  Filled 2020-08-08: qty 22

## 2020-08-08 MED ORDER — METFORMIN HCL 500 MG PO TABS
500.0000 mg | ORAL_TABLET | Freq: Two times a day (BID) | ORAL | Status: DC
  Administered 2020-08-08 – 2020-08-16 (×16): 500 mg via ORAL
  Filled 2020-08-08 (×17): qty 1

## 2020-08-08 MED ORDER — SENNOSIDES-DOCUSATE SODIUM 8.6-50 MG PO TABS
1.0000 | ORAL_TABLET | Freq: Every day | ORAL | Status: DC
  Administered 2020-08-08 – 2020-08-15 (×7): 1 via ORAL
  Filled 2020-08-08 (×7): qty 1

## 2020-08-08 MED ORDER — POLYETHYLENE GLYCOL 3350 17 G PO PACK
17.0000 g | PACK | Freq: Every day | ORAL | Status: DC
  Administered 2020-08-08 – 2020-08-14 (×6): 17 g via ORAL
  Filled 2020-08-08 (×10): qty 1

## 2020-08-08 MED ORDER — INFLUENZA VAC SPLIT QUAD 0.5 ML IM SUSY
0.5000 mL | PREFILLED_SYRINGE | INTRAMUSCULAR | Status: AC
  Administered 2020-08-09: 0.5 mL via INTRAMUSCULAR
  Filled 2020-08-08: qty 0.5

## 2020-08-08 MED ORDER — MUPIROCIN 2 % EX OINT
TOPICAL_OINTMENT | Freq: Two times a day (BID) | CUTANEOUS | Status: DC
  Administered 2020-08-14: 2 via TOPICAL
  Filled 2020-08-08: qty 22

## 2020-08-08 NOTE — Procedures (Signed)
Patient Name: Jeremy Lang  MRN: [id number]  Epilepsy Attending: Charlsie Quest  Referring Physician/Provider: Dr Erick Blinks Date: 08/07/2020 Duration: 23. 48 minutes  Patient history: 54 year old male with history of epilepsy who presented with acute onset right-sided weakness with intermittent aphasia.  EEG to evaluate for seizures.  Level of alertness: Awake  AEDs during EEG study: Keppra, Ativan  Technical aspects: This EEG study was done with scalp electrodes positioned according to the 10-20 International system of electrode placement. Electrical activity was acquired at a sampling rate of 500Hz  and reviewed with a high frequency filter of 70Hz  and a low frequency filter of 1Hz . EEG data were recorded continuously and digitally stored.   Description: No clear posterior dominant rhythm was seen.  There is an excessive amount of 15 to 18 Hz beta activity distributed symmetrically and diffusely.  Hyperventilation and photic stimulation were not performed.     ABNORMALITY -Excessive beta, generalized  IMPRESSION: This study is within normal limits. The excessive beta activity seen in the background is most likely due to the effect of benzodiazepine and is a benign EEG pattern. No seizures or epileptiform discharges were seen throughout the recording.  [name] 

## 2020-08-08 NOTE — Telephone Encounter (Signed)
Hospital Consult needed. Patient has callus and wound.

## 2020-08-08 NOTE — ED Notes (Signed)
Pt back to room at this time

## 2020-08-08 NOTE — ED Notes (Signed)
Pt still currently at MRI. No further updates/ phone call received from them as of now.

## 2020-08-08 NOTE — Consult Note (Signed)
WOC Nurse Consult Note: Reason for Consult: Chronic nonhealing wounds on right great toe and lateral aspect of right foot. Seen emergently in August of this year when radiographic studies were conducted of the right foot.  On those two occasions, findings were negative for osteomyelitis or fracture. It is noted that last evening's attempt at MRI was no successful due to patient movement. Photos taken upon admission and uploaded into the patient's EMR are appreciated. Wound type: Neuropathic Pressure Injury POA: N/A Measurement: To be obtained by bedside RN prior to application of first dressing today Wound bed: right great toe with callus and wound in center. Red, dry wound bed, no exudate. Yellow "halo" of tissue at distal margin of wound may be indicative of fluid or infection. Right lateral foot at 5th metatarsal head with red, dry wound bed, no drainage. Evidence of previous wound healing in this area (scarring) Drainage (amount, consistency, odor) As noted above Periwound: Dry, intact with hygiene needed Dressing procedure/placement/frequency: Guidance for the topical care of these ulcers using xeroform gauze once daily after soap and water cleanse, rinse and pat dry are provided. Recommend consultation and follow up with podiatric medicine for routine paring of the callus and evaluation of the patient's footwear as an orthotic is likely to be required to avoid repetitive damage, and the possibility of infection and digit or limb loss.  If you agree, please order/arrange consult while in house.  WOC nursing team will not follow, but will remain available to this patient, the nursing and medical teams.  Please re-consult if needed. Thanks, Ladona Mow, MSN, RN, GNP, [facility] [place]  Pager# [phone]

## 2020-08-08 NOTE — Progress Notes (Signed)
Floor coverage  Notified by RN that patient had a fall in the bathroom.  He was seen and examined at bedside.  Patient was quite upset and stated he has been very constipated.  Reports having chronic constipation for which she takes 2 different laxatives at home.  States while sitting on the toilet seat he was so constipated that he turned around and put his fingers in the rectum to extract hard stool.  During this process, he lost balance and fell hitting his head on the bathroom door which pushed it open.  He also injured his right elbow and right hip from the fall.  He is having 6-7 out of 10 intensity pain in his right elbow and 8 out of 10 intensity pain in his right hip.  Denies neck pain, back pain, or any other injuries from the fall.  Denies loss of consciousness.  Denies dizziness, chest pain, or shortness of breath.  He is having some mild abdominal discomfort due to being constipated but has not vomited and has been able to eat his meals.  He is passing flatus.  Head: Normocephalic, atraumatic Neck: Normal range of motion Heart: RRR Lungs: CTAB Abdomen: Bowel sounds present, soft, nondistended, nontender to palpation Extremities: Right elbow normal passive range of motion.  Unable to assess range of motion of the hip due to pain. Neuro: AAO x4, no gross focal neuro deficit  -Fall precautions -Stat CT head without contrast -Stat x-ray of right hip -Stat x-ray of right elbow -MiraLAX and Senokot-S for severe constipation -Hold Lovenox at this time, pending imaging results.  SCDs for DVT prophylaxis.

## 2020-08-08 NOTE — Progress Notes (Signed)
Patient yelled for help, when staff went into the room, pt was noted to be sitting down beside the commode, stated he slipped off the commode when he was trying to use bathroom. Stated he hit his r. Side of the head, R elbow and R hip. Pt assisted back to his bed, full assessment completed, no skin tears noted, MD on call paged, awaiting feedback,  Mr Jeremy Lang (Friend) made aware of the fall. Will continue to monitor him

## 2020-08-08 NOTE — ED Notes (Signed)
Provided pt with apple sauce, ginger ale and graham crackers at this time. Pt refused apple sauce and ginger ale. Pt seen eating graham crackers without any difficulty.

## 2020-08-08 NOTE — Evaluation (Signed)
Physical Therapy Evaluation Patient Details Name: Jeremy Lang MRN: [id number] DOB: 1965-11-19 Today's Date: 08/08/2020   History of Present Illness  Pt is a 54 year old male who presented with impaired speech, R facial droop, and R sided weakness. He also displayed a seizure like episode. CT and MRI were negative for any acute abnormalities. NIHSS of 5-6. PMH significant for DM2, CAD, s/p CABG, seizures, prior hemorrhagic lacunar infarct, and schizoaffective disorder.  Clinical Impression  Pt demonstrates inconsistencies with strength and mobility upon exam. For example, pt would display varying MMT scores with subsequent reps on the R LE. Also, he would be able to lift the R LE during bed mobility and gait at times and then be unable to lift it another moment. In addition, during his first STS rep he only required min guard assist and then the 2nd rep he displayed increased difficulty resulting in him requiring minA. Pt demonstrated decreased R foot clearance, excessive R LE external rotation, and step-to gait pattern on the R while ambulating ~120 ft utilizing the RW and receiving minA. He also is impulsive and would lose balance unexpectedly. He demonstrates decreased sensation and coordination with possible dysdiadochokinesia on the R LE. In addition, he has a hx of DM and has wounds present on his foot's plantar surface. The pt lives alone and reports minimal to no available assistance available at home. Currently, he requires min guard assist for modified bed mobility, minA for STS transfers, and minA for gait with modA intermittently when pt would have a bout of LOB. Due to his necessity for assistance and limited support at home, recommending SNF upon d/c. Will continue to follow acutely.     Follow Up Recommendations SNF;Supervision for mobility/OOB    Equipment Recommendations  Rolling walker with 5" wheels;3in1 (PT);Wheelchair (measurements PT);Wheelchair cushion (measurements PT)     Recommendations for Other Services       Precautions / Restrictions Precautions Precautions: Fall Precaution Comments: seizures Restrictions Weight Bearing Restrictions: No      Mobility  Bed Mobility Overal bed mobility: Needs Assistance Bed Mobility: Supine to Sit     Supine to sit: HOB elevated;Min guard     General bed mobility comments: Pt quickly attempting to sit up R EOB and utilized the R bed rail. Pt able to move B LEs off EOB appropriately and ascend trunk.    Transfers Overall transfer level: Needs assistance Equipment used: Rolling walker (2 wheeled) Transfers: Sit to/from Stand Sit to Stand: Min assist         General transfer comment: Inconsistencies noted with STS transfers as pt impulsively came to stand from EOB to demonstrate a task for therapist with only min guard assist and extra time to power up. However, when cued to come to stand on 2nd bout he required minA.  Ambulation/Gait Ambulation/Gait assistance: Min assist Gait Distance (Feet): 120 Feet Assistive device: Rolling walker (2 wheeled) Gait Pattern/deviations: Step-to pattern;Decreased step length - right;Decreased stride length;Staggering left Gait velocity: decreased Gait velocity interpretation: <1.31 ft/sec, indicative of household ambulator General Gait Details: Initially ambulating with step-to pattern with R LE and R LE externally rotated. Foot drag noted throughout on R. VCs and intermittent TCs provided at R LE to increase step length and foot clearance, with moments of success and step through and then return to prior presentation. Required several standing rest breaks during bout.  Stairs            Wheelchair Mobility    Modified Rankin (Stroke  Patients Only)       Balance Overall balance assessment: Needs assistance Sitting-balance support: No upper extremity supported;Feet supported Sitting balance-Leahy Scale: Normal Sitting balance - Comments: Pt able to reach  max off BOS while sitting without LOB and with Supervision for safety.   Standing balance support: Bilateral upper extremity supported;During functional activity Standing balance-Leahy Scale: Poor Standing balance comment: B UE support on RW, no LOB during gait initial ~110 ft but then unexpected stagger to L and then anteriorly with modA to recover.                             Pertinent Vitals/Pain Pain Assessment: Faces Faces Pain Scale: Hurts little more Pain Location: R LE with mobility Pain Descriptors / Indicators: Discomfort;Grimacing;Moaning Pain Intervention(s): Limited activity within patient's tolerance;Monitored during session    Home Living Family/patient expects to be discharged to:: Private residence Living Arrangements: Alone Available Help at Discharge:  (no assistance) Type of Home: House Home Access: Stairs to enter Entrance Stairs-Rails: Right (ascending) Entrance Stairs-Number of Steps: 5 Home Layout: One level Home Equipment: Walker - standard (w/c broken) Additional Comments: Uses cane in house, walker outside of house. Pt wants to find an "assistive living place". "I miss being in the shower."    Prior Function Level of Independence: Independent with assistive device(s)               Hand Dominance        Extremity/Trunk Assessment   Upper Extremity Assessment Upper Extremity Assessment: Defer to OT evaluation    Lower Extremity Assessment Lower Extremity Assessment: RLE deficits/detail;LLE deficits/detail RLE Deficits / Details: Inconsistent contractions with multiple reps, MMT scores of grossly 3- to 4 throughout. Also, inconsistencies noted with MMT and then spontaneous movement of R LE with functional mob tasks. RLE Sensation: decreased light touch;history of peripheral neuropathy RLE Coordination:  (possible dysdiadochokinesia noted; shin rub appropriate) LLE Deficits / Details: MMT scores of 4 to 5 grossly throughout LLE  Sensation: history of peripheral neuropathy LLE Coordination: WNL    Cervical / Trunk Assessment Cervical / Trunk Assessment: Normal  Communication   Communication: No difficulties  Cognition Arousal/Alertness: Awake/alert Behavior During Therapy: Agitated;Impulsive Overall Cognitive Status: No family/caregiver present to determine baseline cognitive functioning                                 General Comments: A&Ox4, but pt impulsive to throw phone when upset upon arrival. Pt impulsive on performing tasks before being cued or unexpected LOB.      General Comments General comments (skin integrity, edema, etc.): 1+ edema R LE and wounds noted dorsal aspect of anterior R foot    Exercises     Assessment/Plan    PT Assessment Patient needs continued PT services  PT Problem List Decreased strength;Decreased activity tolerance;Decreased balance;Decreased mobility;Decreased coordination;Decreased safety awareness;Impaired sensation;Decreased skin integrity;Pain       PT Treatment Interventions DME instruction;Gait training;Stair training;Functional mobility training;Therapeutic activities;Therapeutic exercise;Balance training;Neuromuscular re-education;Wheelchair mobility training    PT Goals (Current goals can be found in the Care Plan section)  Acute Rehab PT Goals Patient Stated Goal: "I need to go to an assistive living place". "I miss being in the shower." PT Goal Formulation: With patient Time For Goal Achievement: 08/22/20 Potential to Achieve Goals: Good    Frequency Min 3X/week   Barriers to discharge Decreased caregiver support  Co-evaluation               AM-PAC PT "6 Clicks" Mobility  Outcome Measure Help needed turning from your back to your side while in a flat bed without using bedrails?: A Little Help needed moving from lying on your back to sitting on the side of a flat bed without using bedrails?: A Little Help needed moving to and  from a bed to a chair (including a wheelchair)?: A Little Help needed standing up from a chair using your arms (e.g., wheelchair or bedside chair)?: A Little Help needed to walk in hospital room?: A Little Help needed climbing 3-5 steps with a railing? : A Lot 6 Click Score: 17    End of Session Equipment Utilized During Treatment: Gait belt Activity Tolerance: Patient limited by fatigue;Patient tolerated treatment well Patient left: in chair;with call bell/phone within reach;with chair alarm set;with nursing/sitter in room Nurse Communication: Mobility status PT Visit Diagnosis: Unsteadiness on feet (R26.81);Other abnormalities of gait and mobility (R26.89);Muscle weakness (generalized) (M62.81);Difficulty in walking, not elsewhere classified (R26.2);Other symptoms and signs involving the nervous system (R29.898)    Time: 3785-8850 PT Time Calculation (min) (ACUTE ONLY): 56 min   Charges:   PT Evaluation $PT Eval Moderate Complexity: 1 Mod PT Treatments $Gait Training: 8-22 mins $Therapeutic Activity: 23-37 mins        Raymond Gurney, PT, DPT Acute Rehabilitation Services  Pager: [phone] Office: [phone]   Jewel Baize 08/08/2020, 1:50 PM

## 2020-08-08 NOTE — Progress Notes (Signed)
   08/08/20 1937  Vitals  Temp 97.8 F (36.6 C)  Temp Source Oral  BP 135/79  MAP (mmHg) 96  BP Location Left Arm  BP Method Automatic  Patient Position (if appropriate) Lying  Pulse Rate 82  Pulse Rate Source Monitor  Resp 20  MEWS COLOR  MEWS Score Color Green  Oxygen Therapy  SpO2 97 %  O2 Device Room Air  MEWS Score  MEWS Temp 0  MEWS Systolic 0  MEWS Pulse 0  MEWS RR 0  MEWS LOC 0  MEWS Score 0

## 2020-08-08 NOTE — Telephone Encounter (Signed)
Will see today.  

## 2020-08-08 NOTE — Consult Note (Signed)
Reason for Consult: chronic right foot ulcerations Referring Physician: Mauro Kaufmann, MD  Jeremy Lang is an 54 y.o. male.  HPI: Recently moved to the [place] area from [facility] several months ago.  He states he has had chronic right foot ulcers for approximately 2 years.  His mother had the same and she had a history of diabetes as well.  He has not had care for them since he moved to the area.  He said he saw few doctors previously in [facility] and they "would put ointment on it and then see me back in a week they would just keep billing my insurance over and over again I think the do this with so they can have a [employer] to put in the parking lot".  Denies fever chills nausea or vomiting from them.  Always has bloody drainage in his socks.  Has been seen in the ER multiple times for this recently.  Past Medical History:  Diagnosis Date  . Diabetes mellitus without complication (HCC)   . Seizures (HCC)   . Stroke [facility])     Past Surgical History:  Procedure Laterality Date  . CORONARY ARTERY BYPASS GRAFT  2009 per pt    Family History  Problem Relation Age of Onset  . Heart disease Mother   . Diabetes Mother   . Heart disease Father   . Diabetes Father     Social History:  reports that he has never smoked. He has never used smokeless tobacco. He reports previous alcohol use. He reports previous drug use.  Allergies: No Known Allergies  Medications: I have reviewed the patient's current medications.  Results for orders placed or performed during the hospital encounter of 08/07/20 (from the past 48 hour(s))  CBG monitoring, ED     Status: None   Collection Time: 08/07/20  6:53 PM  Result Value Ref Range   Glucose-Capillary 95 70 - 99 mg/dL    Comment: Glucose reference range applies only to samples taken after fasting for at least 8 hours.  Protime-INR     Status: None   Collection Time: 08/07/20  6:57 PM  Result Value Ref Range   Prothrombin Time 11.9 11.4 -  15.2 seconds   INR 0.9 0.8 - 1.2    Comment: (NOTE) INR goal varies based on device and disease states. Performed at [facility], [address] [place], [place] [address]   APTT     Status: None   Collection Time: 08/07/20  6:57 PM  Result Value Ref Range   aPTT 25 24 - 36 seconds    Comment: Performed at [facility], [address], [place], [place] [address]  CBC     Status: None   Collection Time: 08/07/20  6:57 PM  Result Value Ref Range   WBC 10.4 4.0 - 10.5 K/uL   RBC 5.41 4.22 - 5.81 MIL/uL   Hemoglobin 15.4 13.0 - 17.0 g/dL   HCT 82.9 39 - 52 %   MCV 86.9 80.0 - 100.0 fL   MCH 28.5 26.0 - 34.0 pg   MCHC 32.8 30.0 - 36.0 g/dL   RDW 56.2 13.0 - 86.5 %   Platelets 202 150 - 400 K/uL   nRBC 0.0 0.0 - 0.2 %    Comment: Performed at [facility], [address], [place], [place] [address]  Differential     Status: None   Collection Time: 08/07/20  6:57 PM  Result Value Ref  Range   Neutrophils Relative % 64 %   Neutro Abs 6.6 1.7 - 7.7 K/uL   Lymphocytes Relative 27 %   Lymphs Abs 2.8 0.7 - 4.0 K/uL   Monocytes Relative 7 %   Monocytes Absolute 0.7 0.1 - 1.0 K/uL   Eosinophils Relative 1 %   Eosinophils Absolute 0.1 0.0 - 0.5 K/uL   Basophils Relative 1 %   Basophils Absolute 0.1 0.0 - 0.1 K/uL   Immature Granulocytes 0 %   Abs Immature Granulocytes 0.04 0.00 - 0.07 K/uL    Comment: Performed at [facility], [address] [place], [place] [address]  Comprehensive metabolic panel     Status: Abnormal   Collection Time: 08/07/20  6:57 PM  Result Value Ref Range   Sodium 135 135 - 145 mmol/L   Potassium 3.8 3.5 - 5.1 mmol/L   Chloride 101 98 - 111 mmol/L   CO2 25 22 - 32 mmol/L   Glucose, Bld 72 70 - 99 mg/dL    Comment: Glucose reference range applies only to samples taken after fasting for at least 8 hours.   BUN 13 6 - 20 mg/dL   Creatinine, Ser 6.041.06 0.61 - 1.24 mg/dL   Calcium 8.9 8.9 - 54.010.3 mg/dL   Total Protein 6.8 6.5 - 8.1  g/dL   Albumin 3.3 (L) 3.5 - 5.0 g/dL   AST 18 15 - 41 U/L   ALT 18 0 - 44 U/L   Alkaline Phosphatase 84 38 - 126 U/L   Total Bilirubin 1.1 0.3 - 1.2 mg/dL   GFR, Estimated >98>60 >11>60 mL/min    Comment: (NOTE) Calculated using the CKD-EPI Creatinine Equation (2021)    Anion gap 9 5 - 15    Comment: Performed at [facility], [address] [place], [place] [address]  I-stat chem 8, ED     Status: Abnormal   Collection Time: 08/07/20  6:58 PM  Result Value Ref Range   Sodium 137 135 - 145 mmol/L   Potassium 3.7 3.5 - 5.1 mmol/L   Chloride 99 98 - 111 mmol/L   BUN 15 6 - 20 mg/dL   Creatinine, Ser 2.951.00 0.61 - 1.24 mg/dL   Glucose, Bld 68 (L) 70 - 99 mg/dL    Comment: Glucose reference range applies only to samples taken after fasting for at least 8 hours.   Calcium, Ion 1.08 (L) 1.15 - 1.40 mmol/L   TCO2 24 22 - 32 mmol/L   Hemoglobin 16.0 13.0 - 17.0 g/dL   HCT 62.147.0 39 - 52 %  Resp Panel by RT PCR (RSV, Flu A&B, Covid) - Nasopharyngeal Swab     Status: None   Collection Time: 08/07/20  7:43 PM   Specimen: Nasopharyngeal Swab  Result Value Ref Range   SARS Coronavirus 2 by RT PCR NEGATIVE NEGATIVE    Comment: (NOTE) SARS-CoV-2 target nucleic acids are NOT DETECTED.  The SARS-CoV-2 RNA is generally detectable in upper respiratoy specimens during the acute phase of infection. The lowest concentration of SARS-CoV-2 viral copies this assay can detect is 131 copies/mL. A negative result does not preclude SARS-Cov-2 infection and should not be used as the sole basis for treatment or other patient management decisions. A negative result may occur with  improper specimen collection/handling, submission of specimen other than nasopharyngeal swab, presence of viral mutation(s) within the areas targeted by this assay, and inadequate number of viral copies (<131 copies/mL). A negative result must be combined  with clinical observations, patient history, and epidemiological  information. The expected result is Negative.  Fact Sheet for Patients:  https://www.moore.com/  Fact Sheet for Healthcare Providers:  https://www.young.biz/  This test is no t yet approved or cleared by the [place] FDA and  has been authorized for detection and/or diagnosis of SARS-CoV-2 by FDA under an Emergency Use Authorization (EUA). This EUA will remain  in effect (meaning this test can be used) for the duration of the COVID-19 declaration under Section 564(b)(1) of the Act, 21 U.S.C. section 360bbb-3(b)(1), unless the authorization is terminated or revoked sooner.     Influenza A by PCR NEGATIVE NEGATIVE   Influenza B by PCR NEGATIVE NEGATIVE    Comment: (NOTE) The Xpert Xpress SARS-CoV-2/FLU/RSV assay is intended as an aid in  the diagnosis of influenza from Nasopharyngeal swab specimens and  should not be used as a sole basis for treatment. Nasal washings and  aspirates are unacceptable for Xpert Xpress SARS-CoV-2/FLU/RSV  testing.  Fact Sheet for Patients: https://www.moore.com/  Fact Sheet for Healthcare Providers: https://www.young.biz/  This test is not yet approved or cleared by the [place] FDA and  has been authorized for detection and/or diagnosis of SARS-CoV-2 by  FDA under an Emergency Use Authorization (EUA). This EUA will remain  in effect (meaning this test can be used) for the duration of the  Covid-19 declaration under Section 564(b)(1) of the Act, 21  U.S.C. section 360bbb-3(b)(1), unless the authorization is  terminated or revoked.    Respiratory Syncytial Virus by PCR NEGATIVE NEGATIVE    Comment: (NOTE) Fact Sheet for Patients: https://www.moore.com/  Fact Sheet for Healthcare Providers: https://www.young.biz/  This test is not yet approved or cleared by the [place] FDA and  has been authorized for detection  and/or diagnosis of SARS-CoV-2 by  FDA under an Emergency Use Authorization (EUA). This EUA will remain  in effect (meaning this test can be used) for the duration of the  COVID-19 declaration under Section 564(b)(1) of the Act, 21 U.S.C.  section 360bbb-3(b)(1), unless the authorization is terminated or  revoked. Performed at [facility], [address], [place], [place] [address]   HIV Antibody (routine testing w rflx)     Status: None   Collection Time: 08/08/20  3:46 AM  Result Value Ref Range   HIV Screen 4th Generation wRfx Non Reactive Non Reactive    Comment: Performed at [facility], [address] [place], [place] [address]  Hemoglobin A1c     Status: Abnormal   Collection Time: 08/08/20  3:46 AM  Result Value Ref Range   Hgb A1c MFr Bld 8.2 (H) 4.8 - 5.6 %    Comment: (NOTE) Pre diabetes:          5.7%-6.4%  Diabetes:              >6.4%  Glycemic control for   <7.0% adults with diabetes    Mean Plasma Glucose 188.64 mg/dL    Comment: Performed at [facility], [address], [place], [place] [address]  Lipid panel     Status: Abnormal   Collection Time: 08/08/20  3:46 AM  Result Value Ref Range   Cholesterol 224 (H) 0 - 200 mg/dL   Triglycerides 270 <350 mg/dL   HDL 54 >09 mg/dL   Total CHOL/HDL Ratio 4.1 RATIO   VLDL 23 0 - 40 mg/dL   LDL Cholesterol 381 (H) 0 - 99 mg/dL    Comment:  Total Cholesterol/HDL:CHD Risk Coronary Heart Disease Risk Table                     Men   Women  1/2 Average Risk   3.4   3.3  Average Risk       5.0   4.4  2 X Average Risk   9.6   7.1  3 X Average Risk  23.4   11.0        Use the calculated Patient Ratio above and the CHD Risk Table to determine the patient's CHD Risk.        ATP III CLASSIFICATION (LDL):  <100     mg/dL   Optimal  409-811  mg/dL   Near or Above                    Optimal  130-159  mg/dL   Borderline  914-782  mg/dL   High  >956     mg/dL   Very High Performed at [facility], [address], [place], [place] [address]   Urine rapid drug screen (hosp performed)     Status: None   Collection Time: 08/08/20  3:57 AM  Result Value Ref Range   Opiates NONE DETECTED NONE DETECTED   Cocaine NONE DETECTED NONE DETECTED   Benzodiazepines NONE DETECTED NONE DETECTED   Amphetamines NONE DETECTED NONE DETECTED   Tetrahydrocannabinol NONE DETECTED NONE DETECTED   Barbiturates NONE DETECTED NONE DETECTED    Comment: (NOTE) DRUG SCREEN FOR MEDICAL PURPOSES ONLY.  IF CONFIRMATION IS NEEDED FOR ANY PURPOSE, NOTIFY LAB WITHIN 5 DAYS.  LOWEST DETECTABLE LIMITS FOR URINE DRUG SCREEN Drug Class                     Cutoff (ng/mL) Amphetamine and metabolites    1000 Barbiturate and metabolites    200 Benzodiazepine                 200 Tricyclics and metabolites     300 Opiates and metabolites        300 Cocaine and metabolites        300 THC                            50 Performed at [facility], [address], [place], [place] [address]   Urinalysis, Routine w reflex microscopic Urine, Clean Catch     Status: Abnormal   Collection Time: 08/08/20  3:58 AM  Result Value Ref Range   Color, Urine YELLOW YELLOW   APPearance CLEAR CLEAR   Specific Gravity, Urine 1.044 (H) 1.005 - 1.030   pH 5.0 5.0 - 8.0   Glucose, UA >=500 (A) NEGATIVE mg/dL   Hgb urine dipstick SMALL (A) NEGATIVE   Bilirubin Urine NEGATIVE NEGATIVE   Ketones, ur NEGATIVE NEGATIVE mg/dL   Protein, ur >=696 (A) NEGATIVE mg/dL   Nitrite NEGATIVE NEGATIVE   Leukocytes,Ua NEGATIVE NEGATIVE   RBC / HPF 0-5 0 - 5 RBC/hpf   WBC, UA 0-5 0 - 5 WBC/hpf   Bacteria, UA RARE (A) NONE SEEN   Mucus PRESENT    Hyaline Casts, UA PRESENT     Comment: Performed at [facility], [address] [place], [place] [address]  Glucose, capillary     Status: Abnormal   Collection Time: 08/08/20  6:03 AM  Result Value Ref Range  Glucose-Capillary 209 (H) 70 - 99 mg/dL    Comment: Glucose  reference range applies only to samples taken after fasting for at least 8 hours.   Comment 1 Notify RN    Comment 2 Document in Chart   Glucose, capillary     Status: Abnormal   Collection Time: 08/08/20 11:42 AM  Result Value Ref Range   Glucose-Capillary 221 (H) 70 - 99 mg/dL    Comment: Glucose reference range applies only to samples taken after fasting for at least 8 hours.    EEG  Result Date: 08/08/2020 Charlsie Quest, MD     08/08/2020 11:11 AM Patient Name: Andru Genter MRN: [id number] Epilepsy Attending: Charlsie Quest Referring Physician/Provider: Dr Erick Blinks Date: 08/07/2020 Duration: 23. 48 minutes Patient history: 54 year old male with history of epilepsy who presented with acute onset right-sided weakness with intermittent aphasia.  EEG to evaluate for seizures. Level of alertness: Awake AEDs during EEG study: Keppra, Ativan Technical aspects: This EEG study was done with scalp electrodes positioned according to the 10-20 International system of electrode placement. Electrical activity was acquired at a sampling rate of 500Hz  and reviewed with a high frequency filter of 70Hz  and a low frequency filter of 1Hz . EEG data were recorded continuously and digitally stored. Description: No clear posterior dominant rhythm was seen.  There is an excessive amount of 15 to 18 Hz beta activity distributed symmetrically and diffusely.  Hyperventilation and photic stimulation were not performed.   ABNORMALITY -Excessive beta, generalized IMPRESSION: This study is within normal limits. The excessive beta activity seen in the background is most likely due to the effect of benzodiazepine and is a benign EEG pattern. No seizures or epileptiform discharges were seen throughout the recording. Charlsie Quest   MR BRAIN WO CONTRAST  Result Date: 08/08/2020 CLINICAL DATA:  Acute right-sided weakness EXAM: MRI HEAD WITHOUT CONTRAST TECHNIQUE: Multiplanar, multiecho pulse sequences of the  brain and surrounding structures were obtained without intravenous contrast. COMPARISON:  None. FINDINGS: Brain: No acute infarct, acute hemorrhage or extra-axial collection. Old left corona radiata small vessel infarct. There is multifocal periventricular white matter hyperintensity, most often a result of chronic microvascular ischemia. There is generalized atrophy without lobar predilection. No chronic microhemorrhage. Normal midline structures. Vascular: Normal flow voids. Skull and upper cervical spine: Normal marrow signal. Sinuses/Orbits: Bilateral maxillary sinus mucosal thickening. Normal orbits. Other: None. IMPRESSION: 1. No acute intracranial abnormality. 2. Old left corona radiata small vessel infarct. Electronically Signed   By: Deatra Robinson M.D.   On: 08/08/2020 00:55   CT HEAD CODE STROKE WO CONTRAST  Result Date: 08/07/2020 CLINICAL DATA:  Code stroke. 54 year old male with right side weakness. EXAM: CT HEAD WITHOUT CONTRAST TECHNIQUE: Contiguous axial images were obtained from the base of the skull through the vertex without intravenous contrast. COMPARISON:  Head CT 04/23/2020.  Brain MRI 04/24/2020. FINDINGS: Brain: Chronic lacunar infarct of the left corona radiata and lentiform appears stable since July. Stable gray-white matter differentiation elsewhere. Stable chronic encephalomalacia in the left amygdala. No midline shift, ventriculomegaly, mass effect, evidence of mass lesion, intracranial hemorrhage or evidence of cortically based acute infarction. Vascular: Calcified atherosclerosis at the skull base. No suspicious intracranial vascular hyperdensity. Skull: No acute osseous abnormality identified. Sinuses/Orbits: Increased fluid in the left maxillary sinus. Increased right maxillary mucosal thickening. Other visualized paranasal sinuses and mastoids are stable and well pneumatized. Other: No acute orbit or scalp soft tissue finding. ASPECTS [facility] Stroke Program Early CT Score)  Total  score (0-10 with 10 being normal): 10 (chronic encephalomalacia in the left hemisphere). IMPRESSION: 1. Stable non contrast CT appearance of the brain since July, with chronic small vessel disease in the left corona radiata and chronic left amygdala encephalomalacia. 2. No acute cortically based infarct or acute intracranial hemorrhage identified. ASPECTS 10. 3. These results were communicated to Dr. Derry Lory at 7:08 pmon 11/10/2021by text page via the [facility] messaging system. Electronically Signed   By: Odessa Fleming M.D.   On: 08/07/2020 19:09   CT ANGIO HEAD CODE STROKE  Result Date: 08/07/2020 CLINICAL DATA:  54 year old male code stroke presentation. Right side weakness. EXAM: CT ANGIOGRAPHY HEAD AND NECK TECHNIQUE: Multidetector CT imaging of the head and neck was performed using the standard protocol during bolus administration of intravenous contrast. Multiplanar CT image reconstructions and MIPs were obtained to evaluate the vascular anatomy. Carotid stenosis measurements (when applicable) are obtained utilizing NASCET criteria, using the distal internal carotid diameter as the denominator. CONTRAST:  75mL OMNIPAQUE IOHEXOL 350 MG/ML SOLN COMPARISON:  Plain head CT 1858 hours today. Brain MRI and intracranial MRA 04/24/2020. FINDINGS: CTA NECK Skeleton: Prior sternotomy. Absent dentition. No acute osseous abnormality identified. Bilateral maxillary sinus disease again noted. Upper chest: Calcified coronary artery atherosclerosis. Visible central pulmonary arteries appear patent. Negative upper lungs. Other neck: Reflux of venous contrast throughout the right neck including in the right EJ and retromandibular vein to the level of the temporalis muscle. No other acute finding identified. Aortic arch: 4 vessel arch configuration, the left vertebral arises directly from the arch. Mild arch atherosclerosis. Prior CABG. Right carotid system: Negative brachiocephalic artery and right CCA. Mild soft and  calcified plaque at the right carotid bifurcation without stenosis. Mildly tortuous cervical right ICA. Left carotid system: Mild plaque in the left CCA proximal to the bifurcation without stenosis. Moderate soft and calcified plaque at the left ICA origin, but no stenosis. Vertebral arteries: Mild plaque in the proximal right subclavian artery without stenosis. Right vertebral artery origin is patent. The right V1 segment is obscured by surrounding venous contrast and streak artifact, but the visible right vertebral appears patent and normal to the skull base. The right vertebral artery is dominant. Non dominant left vertebral artery arises directly from the arch. The left vertebral remains diminutive but patent to the skull base. CTA HEAD Posterior circulation: Right vertebral supplies the basilar as on the July MRA. Normal right PICA origin. Left vertebral functionally terminates in PICA. No right vertebral or basilar artery stenosis. Patent SCA origins. Fetal type right PCA origin again noted. Normal left PCA origin with diminutive left posterior communicating artery and stable moderate left P1/P2 stenosis (series 10, image 121). Mild to moderate right P2 and P3 segment irregularity and stenosis appears stable on series 11, image 20. Anterior circulation: Both ICA siphons are patent. Moderate calcified plaque on the left but no left siphon stenosis. Similar moderate plaque on the right with mild distal petrous stenosis only. Normal right posterior communicating artery origin. Normal ophthalmic artery origins. Patent carotid termini. Dominant left and diminutive or absent right ACA A1 segment with ectatic anterior communicating artery as before. The anterior communicating also appears mildly fenestrated. There is no discrete saccular aneurysm. ACAs appears stable since July. Left MCA M1 remains patent with stable mild to moderate irregularity and stenosis on series 10, image 17. Left MCA trifurcation and left MCA  branches are patent and appear stable. Right MCA M1 segment is stable with mild irregularity. Patent right MCA trifurcation. Right  MCA branches are stable and within normal limits. Venous sinuses: Early contrast timing, not well evaluated. Anatomic variants: Dominant right vertebral artery supplies the basilar. Non dominant left vertebral arises from the arch and functionally terminates in PICA. Dominant left and diminutive or absent right ACA A1 segments. Review of the MIP images confirms the above findings IMPRESSION: 1. Negative for large vessel occlusion. 2. Stable since the July MRA, with intracranial atherosclerosis and moderate stenoses of the Left MCA M1, Left PCA P1/P2. 3. Cervical Carotid atherosclerosis without significant stenosis. 4. Prior CABG. These results were communicated to Dr. Erick Blinks at 7:38 pmon 11/10/2021by text page via the [facility] messaging system. Electronically Signed   By: Odessa Fleming M.D.   On: 08/07/2020 19:38   CT ANGIO NECK CODE STROKE  Result Date: 08/07/2020 CLINICAL DATA:  54 year old male code stroke presentation. Right side weakness. EXAM: CT ANGIOGRAPHY HEAD AND NECK TECHNIQUE: Multidetector CT imaging of the head and neck was performed using the standard protocol during bolus administration of intravenous contrast. Multiplanar CT image reconstructions and MIPs were obtained to evaluate the vascular anatomy. Carotid stenosis measurements (when applicable) are obtained utilizing NASCET criteria, using the distal internal carotid diameter as the denominator. CONTRAST:  61mL OMNIPAQUE IOHEXOL 350 MG/ML SOLN COMPARISON:  Plain head CT 1858 hours today. Brain MRI and intracranial MRA 04/24/2020. FINDINGS: CTA NECK Skeleton: Prior sternotomy. Absent dentition. No acute osseous abnormality identified. Bilateral maxillary sinus disease again noted. Upper chest: Calcified coronary artery atherosclerosis. Visible central pulmonary arteries appear patent. Negative upper lungs.  Other neck: Reflux of venous contrast throughout the right neck including in the right EJ and retromandibular vein to the level of the temporalis muscle. No other acute finding identified. Aortic arch: 4 vessel arch configuration, the left vertebral arises directly from the arch. Mild arch atherosclerosis. Prior CABG. Right carotid system: Negative brachiocephalic artery and right CCA. Mild soft and calcified plaque at the right carotid bifurcation without stenosis. Mildly tortuous cervical right ICA. Left carotid system: Mild plaque in the left CCA proximal to the bifurcation without stenosis. Moderate soft and calcified plaque at the left ICA origin, but no stenosis. Vertebral arteries: Mild plaque in the proximal right subclavian artery without stenosis. Right vertebral artery origin is patent. The right V1 segment is obscured by surrounding venous contrast and streak artifact, but the visible right vertebral appears patent and normal to the skull base. The right vertebral artery is dominant. Non dominant left vertebral artery arises directly from the arch. The left vertebral remains diminutive but patent to the skull base. CTA HEAD Posterior circulation: Right vertebral supplies the basilar as on the July MRA. Normal right PICA origin. Left vertebral functionally terminates in PICA. No right vertebral or basilar artery stenosis. Patent SCA origins. Fetal type right PCA origin again noted. Normal left PCA origin with diminutive left posterior communicating artery and stable moderate left P1/P2 stenosis (series 10, image 121). Mild to moderate right P2 and P3 segment irregularity and stenosis appears stable on series 11, image 20. Anterior circulation: Both ICA siphons are patent. Moderate calcified plaque on the left but no left siphon stenosis. Similar moderate plaque on the right with mild distal petrous stenosis only. Normal right posterior communicating artery origin. Normal ophthalmic artery origins. Patent  carotid termini. Dominant left and diminutive or absent right ACA A1 segment with ectatic anterior communicating artery as before. The anterior communicating also appears mildly fenestrated. There is no discrete saccular aneurysm. ACAs appears stable since July. Left MCA M1  remains patent with stable mild to moderate irregularity and stenosis on series 10, image 17. Left MCA trifurcation and left MCA branches are patent and appear stable. Right MCA M1 segment is stable with mild irregularity. Patent right MCA trifurcation. Right MCA branches are stable and within normal limits. Venous sinuses: Early contrast timing, not well evaluated. Anatomic variants: Dominant right vertebral artery supplies the basilar. Non dominant left vertebral arises from the arch and functionally terminates in PICA. Dominant left and diminutive or absent right ACA A1 segments. Review of the MIP images confirms the above findings IMPRESSION: 1. Negative for large vessel occlusion. 2. Stable since the July MRA, with intracranial atherosclerosis and moderate stenoses of the Left MCA M1, Left PCA P1/P2. 3. Cervical Carotid atherosclerosis without significant stenosis. 4. Prior CABG. These results were communicated to Dr. Erick Blinks at 7:38 pmon 11/10/2021by text page via the [facility] messaging system. Electronically Signed   By: Odessa Fleming M.D.   On: 08/07/2020 19:38    Review of Systems  Constitutional: Negative.   HENT: Negative.   Eyes: Negative.   Respiratory: Negative.   Cardiovascular: Negative.   Gastrointestinal: Negative.   Genitourinary: Negative.   Musculoskeletal: Positive for joint pain (Pain along ulcers of right foot).  Skin:       Ulcers of right foot  Neurological: Positive for sensory change.  Endo/Heme/Allergies: Negative.   Psychiatric/Behavioral: The patient is nervous/anxious.        He has an odd affect, speaks rapidly, is very distrustful of the healthcare system   Blood pressure 138/86, pulse 67,  temperature 98.5 F (36.9 C), temperature source Oral, resp. rate 13, height 5\' 7"  (1.702 m), weight 106.6 kg, SpO2 99 %.  Vitals:   08/08/20 0740 08/08/20 1000  BP: 125/75 138/86  Pulse: 74 67  Resp: 14 13  Temp: 97.8 F (36.6 C) 98.5 F (36.9 C)  SpO2: 98% 99%    General AA&O x3. Normal mood and affect.  Vascular Dorsalis pedis and posterior tibial pulses  present 2+ right  Capillary refill normal to all digits. Pedal hair growth normal.  Neurologic Epicritic sensation grossly absent.  Dermatologic (Wound) Wound Location: Subhallux approximately 1 cm in diameter, submet 5 approximately 0.5 cm diameter Wound Base: Significant hyperkeratosis, mixed granular fibrotic wound bed under Peri-wound: Calloused Exudate: Scant/small amount Serous exudate  Orthopedic: Motor intact BLE.         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  Recommend topical antibiotics with mupirocin ointment -WB Status: WBAT in a surgical offloading shoe -Wound Care: Would apply mupirocin daily and a foam border dressing.  He should continue this through discharge, if he cannot get foam border dressings can use gauze and tape -Surgical Plan: No surgery indicated at this point -Discussed with him his multiple risk factor for nonhealing ulcerations including elevated A1c as well as a smoking.  I encouraged him to quit smoking so that he can heal these wounds.  Also discussed with him that his A1c must be lowered to improve his wound healing capacity -I will point he discussed with me that he would like to have me cut off his toes and may be part of his foot.  I discussed with him that this would not be reasonable and is not currently indicated.  He said "advised to cut it off he  have to cut it  off" -I recommend consultation with psychiatry, I suspect given his mood and affect and discussions I had with him that he has undiagnosed and untreated mental illness as well as significant social needs that social work may need to help with so that he may continue to receive care as an outpatient.  He is at high risk of limb loss without regular treatment.. -We will work on offloading the ulcers and regular debridement in the office as an outpatient.  He should follow up with me 1 week after discharge at my office at  [facility]  [address], [place] [address]  Edwin Cap 08/08/2020, 11:44 AM   Best available via secure chat for questions or concerns.

## 2020-08-08 NOTE — ED Notes (Signed)
Entered pt room d/t monitor stating bp cuff malfunction, adjusted pt bp cuff, re-checked bp and wnl. Pt sleeping, chest rising and falling, vs wnl, call bell on bed by pt head, table next to pt bed with urinal, pt in NAD, will continue to monitor.

## 2020-08-08 NOTE — NC FL2 (Signed)
[place] MEDICAID FL2 LEVEL OF CARE SCREENING TOOL     IDENTIFICATION  Patient Name: Jeremy Lang Birthdate: [birth year] Sex: male Admission Date (Current Location): 08/07/2020  [facility] and [place] Number:  Producer, television/film/video and Address:  [facility] [place], [address], [place], [place] [address]      Provider Number: [id number]  Attending Physician Name and Address:  Meredeth Ide, MD  Relative Name and Phone Number:       Current Level of Care: Hospital Recommended Level of Care: Skilled Nursing Facility Prior Approval Number:    Date Approved/Denied:   PASRR Number: Manual review  Discharge Plan: SNF    Current Diagnoses: Patient Active Problem List   Diagnosis Date Noted  . Uncontrolled type 2 diabetes with neuropathy (HCC)   . 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 Acute right-sided weakness 08/07/2020  . Diabetes mellitus without complication (HCC)   . Seizure-like activity (HCC)     Orientation RESPIRATION BLADDER Height & Weight     Self, Time, Situation, Place  Normal Continent Weight: 235 lb 0.2 oz (106.6 kg) Height:  5\' 7"  (170.2 cm)  BEHAVIORAL SYMPTOMS/MOOD NEUROLOGICAL BOWEL NUTRITION STATUS    Convulsions/Seizures Continent Diet (heart healthy/carb modified)  AMBULATORY STATUS COMMUNICATION OF NEEDS Skin   Limited Assist Verbally Other (Comment) (right foot ulcer, xeroform gauze once daily after soap and water cleanse, rinse and pat dry are provided)                       Personal Care Assistance Level of Assistance  Bathing, Feeding, Dressing Bathing Assistance: Limited assistance Feeding assistance: Independent Dressing Assistance: Limited assistance     Functional Limitations Info  Speech     Speech Info: Impaired (dysarthria)    SPECIAL CARE FACTORS FREQUENCY  PT (By licensed PT), OT (By licensed OT)     PT Frequency: 5x/wk OT Frequency: 5x/wk             Contractures Contractures Info: Not present    Additional Factors Info  Code Status, Allergies Code Status Info: Full Allergies Info: NKA           Current Medications (08/08/2020):  This is the current hospital active medication list Current Facility-Administered Medications  Medication Dose Route Frequency Provider Last Rate Last Admin  . acetaminophen (TYLENOL) tablet 650 mg  650 mg Oral Q4H PRN 13/07/2020, MD       Or  . acetaminophen (TYLENOL) 160 MG/5ML solution 650 mg  650 mg Per Tube Q4H PRN Charlsie Quest, MD       Or  . acetaminophen (TYLENOL) suppository 650 mg  650 mg Rectal Q4H PRN Charlsie Quest R, MD      . enoxaparin (LOVENOX) injection 40 mg  40 mg Subcutaneous Q24H Darreld Mclean R, MD   40 mg at 08/08/20 0935  . [START ON 08/09/2020] influenza vac split quadrivalent PF (FLUARIX) injection 0.5 mL  0.5 mL Intramuscular Tomorrow-1000 13/08/2020, Gagan S, MD      . insulin aspart (novoLOG) injection 0-9 Units  0-9 Units Subcutaneous TID WC 10-18-1975, MD   3 Units at 08/08/20 1216  . insulin glargine (LANTUS) injection 5 Units  5 Units Subcutaneous QHS 13/11/21, Sharl Ma, MD      . levETIRAcetam (KEPPRA) IVPB 1000 mg/100 mL premix  1,000 mg Intravenous Once Sarina Ill, MD   Held at 08/07/20 1938  .  metFORMIN (GLUCOPHAGE) tablet 500 mg  500 mg Oral BID WC Meredeth Ide, MD      . mupirocin ointment (BACTROBAN) 2 %   Topical BID Edwin Cap, DPM      . [START ON 08/09/2020] pneumococcal 23 valent vaccine (PNEUMOVAX-23) injection 0.5 mL  0.5 mL Intramuscular Tomorrow-1000 [place], Sarina Ill, MD      . senna-docusate (Senokot-S) tablet 1 tablet  1 tablet Oral QHS PRN Charlsie Quest, MD         Discharge Medications: Please see discharge summary for a list of discharge medications.  Relevant Imaging Results:  Relevant Lab Results:   Additional Information SS#: [id number]  Baldemar Lenis, LCSW

## 2020-08-08 NOTE — ED Notes (Signed)
Received report from Vernona Rieger RN, pt currently at MRI at this time.   MRI tech called and reports pt was unable to keep still and they will reach out to attending physician.

## 2020-08-08 NOTE — Progress Notes (Signed)
SLP Cancellation Note  Patient Details Name: Effie Janoski MRN: [id number] DOB: 08-Aug-1966   Cancelled treatment:       Reason Eval/Treat Not Completed: Patient at procedure or test/unavailable. Pt was initially receiving care from RN and subsequently on the phone. SLP will f/u.   Nateisha Moyd I. Vear Clock, MS, CCC-SLP Acute Rehabilitation Services Office number [phone] Pager [phone]  Scheryl Marten 08/08/2020, 4:51 PM

## 2020-08-08 NOTE — ED Notes (Signed)
Pt passed swallow screen. Oriented and not slurring his speech, sent message to attending regarding further diet orders.

## 2020-08-08 NOTE — Progress Notes (Addendum)
[facility]  PROGRESS NOTE  Jeremy Lang JFH:[id number] DOB: 01-May-1966 DOA: 08/07/2020 PCP: Patient, No Pcp Per   Brief HPI:   54 year old male with history of diabetes mellitus type 2, CAD s/p CABG, seizures, prior hemorrhagic liver infarct, schizoaffective disorder came to ED for evaluation of acute right-sided weakness.  Patient states that he had new onset right upper and lower extremity weakness with difficulty speaking.  CT head showed no acute infarct.  CTA head and neck was negative for large vessel occlusion.  Neurology was consulted, MRI brain and EEG were recommended.    Subjective   Patient seen and examined, still complains of right upper and lower extremity weakness.   Assessment/Plan:     1. Right-sided weakness-patient's exam is not consistent, also documented by neurology that patient likely has psychogenic component.  MRI brain is negative for stroke.  Neurology has signed off. 2. Right diabetic foot ulcer-wound care was consulted, and recommended podiatry consultation.  Will consult podiatry for diabetic foot ulcer and also will need a better footwear to avoid possibility of infection and injury to limb loss. 3. Schizoaffective disorder-patient has schizoaffective disorder, currently on no medications.  He presented with psychogenic seizure/right-sided weakness.  Will consult psychiatry for medication optimization. 4. CAD s/p CABG-patient reports prior CABG in 2009, currently on no medications. 5. Diabetes mellitus type 2-continue sliding scale insulin with NovoLog.  Hemoglobin A1c is 8.2.  Will start Metformin 500 twice daily.  Also start Lantus 5 units subcu nightly. 6. Seizure-like activity-patient had seizure-like activity in the ED while in the CT scanner.  He received IV Keppra and Ativan.  Neurology saw the patient and it seems that he had a psychogenic seizure.  No further antiepileptic medications recommended.  EEG is negative for epileptiform  discharges.     COVID-19 Labs  No results for input(s): DDIMER, FERRITIN, LDH, CRP in the last 72 hours.  Lab Results  Component Value Date   SARSCOV2NAA NEGATIVE 08/07/2020     Scheduled medications:   . enoxaparin (LOVENOX) injection  40 mg Subcutaneous Q24H  . [START ON 08/09/2020] influenza vac split quadrivalent PF  0.5 mL Intramuscular Tomorrow-1000  . insulin aspart  0-9 Units Subcutaneous TID WC  . mupirocin ointment   Topical BID  . [START ON 08/09/2020] pneumococcal 23 valent vaccine  0.5 mL Intramuscular Tomorrow-1000         CBG: Recent Labs  Lab 08/07/20 1853 08/08/20 0603 08/08/20 1142  GLUCAP 95 209* 221*    SpO2: 99 %    CBC: Recent Labs  Lab 08/07/20 1857 08/07/20 1858  WBC 10.4  --   NEUTROABS 6.6  --   HGB 15.4 16.0  HCT 47.0 47.0  MCV 86.9  --   PLT 202  --     Basic Metabolic Panel: Recent Labs  Lab 08/07/20 1857 08/07/20 1858  NA 135 137  K 3.8 3.7  CL 101 99  CO2 25  --   GLUCOSE 72 68*  BUN 13 15  CREATININE 1.06 1.00  CALCIUM 8.9  --      Liver Function Tests: Recent Labs  Lab 08/07/20 1857  AST 18  ALT 18  ALKPHOS 84  BILITOT 1.1  PROT 6.8  ALBUMIN 3.3*     Antibiotics: Anti-infectives (From admission, onward)   None       DVT prophylaxis: Lovenox  Code Status: Full code  Family Communication: No family at bedside   Consultants:  Neurology  Procedures:  Objective   Vitals:   08/08/20 0400 08/08/20 0514 08/08/20 0740 08/08/20 1000  BP: (!) 141/99 (!) 141/99 125/75 138/86  Pulse: 73 71 74 67  Resp: 14 19 14 13   Temp:  97.7 F (36.5 C) 97.8 F (36.6 C) 98.5 F (36.9 C)  TempSrc:  Oral Oral Oral  SpO2: 98% 97% 98% 99%  Weight:      Height:        Intake/Output Summary (Last 24 hours) at 08/08/2020 1344 Last data filed at 08/08/2020 1100 Gross per 24 hour  Intake 1044 ml  Output 175 ml  Net 869 ml    11/09 1901 - 11/11 0700 In: 480 [P.O.:480] Out: -   Filed  Weights   08/07/20 1932  Weight: 106.6 kg    Physical Examination:    General: Appears in no acute distress  Cardiovascular: S1-S2, regular, no murmur auscultated  Respiratory: Clear to auscultation bilaterally, no wheezing or crackles auscultated  Abdomen: Abdomen is soft, nontender, no organomegaly  Extremities: No edema in the lower extremities, chronic looking ulcer noted on the ventral surface of right big toe, also on the plantar surface of right foot  Neurologic: Cranial nerves II through XII grossly intact, has weakness in right upper and lower extremity however able to move extremities when distracted.   Status is: Inpatient  Dispo: The patient is from: Home              Anticipated d/c is to: Skilled nursing facility              Anticipated d/c date is: 08/10/2020              Patient currently not medically stable for discharge  Barrier to discharge-ongoing  evaluation for stroke, right foot ulcer, unsafe discharge due to lack of insight and schizoaffective disorder. Psych consulted.      Data Reviewed:   Recent Results (from the past 240 hour(s))  Resp Panel by RT PCR (RSV, Flu A&B, Covid) - Nasopharyngeal Swab     Status: None   Collection Time: 08/07/20  7:43 PM   Specimen: Nasopharyngeal Swab  Result Value Ref Range Status   SARS Coronavirus 2 by RT PCR NEGATIVE NEGATIVE Final    Comment: (NOTE) SARS-CoV-2 target nucleic acids are NOT DETECTED.  The SARS-CoV-2 RNA is generally detectable in upper respiratoy specimens during the acute phase of infection. The lowest concentration of SARS-CoV-2 viral copies this assay can detect is 131 copies/mL. A negative result does not preclude SARS-Cov-2 infection and should not be used as the sole basis for treatment or other patient management decisions. A negative result may occur with  improper specimen collection/handling, submission of specimen other than nasopharyngeal swab, presence of viral mutation(s)  within the areas targeted by this assay, and inadequate number of viral copies (<131 copies/mL). A negative result must be combined with clinical observations, patient history, and epidemiological information. The expected result is Negative.  Fact Sheet for Patients:  https://www.moore.com/https://www.fda.gov/media/142436/download  Fact Sheet for Healthcare Providers:  https://www.young.biz/https://www.fda.gov/media/142435/download  This test is no t yet approved or cleared by the [place] FDA and  has been authorized for detection and/or diagnosis of SARS-CoV-2 by FDA under an Emergency Use Authorization (EUA). This EUA will remain  in effect (meaning this test can be used) for the duration of the COVID-19 declaration under Section 564(b)(1) of the Act, 21 U.S.C. section 360bbb-3(b)(1), unless the authorization is terminated or revoked sooner.     Influenza A by PCR  NEGATIVE NEGATIVE Final   Influenza B by PCR NEGATIVE NEGATIVE Final    Comment: (NOTE) The Xpert Xpress SARS-CoV-2/FLU/RSV assay is intended as an aid in  the diagnosis of influenza from Nasopharyngeal swab specimens and  should not be used as a sole basis for treatment. Nasal washings and  aspirates are unacceptable for Xpert Xpress SARS-CoV-2/FLU/RSV  testing.  Fact Sheet for Patients: https://www.moore.com/  Fact Sheet for Healthcare Providers: https://www.young.biz/  This test is not yet approved or cleared by the [place] FDA and  has been authorized for detection and/or diagnosis of SARS-CoV-2 by  FDA under an Emergency Use Authorization (EUA). This EUA will remain  in effect (meaning this test can be used) for the duration of the  Covid-19 declaration under Section 564(b)(1) of the Act, 21  U.S.C. section 360bbb-3(b)(1), unless the authorization is  terminated or revoked.    Respiratory Syncytial Virus by PCR NEGATIVE NEGATIVE Final    Comment: (NOTE) Fact Sheet for  Patients: https://www.moore.com/  Fact Sheet for Healthcare Providers: https://www.young.biz/  This test is not yet approved or cleared by the [place] FDA and  has been authorized for detection and/or diagnosis of SARS-CoV-2 by  FDA under an Emergency Use Authorization (EUA). This EUA will remain  in effect (meaning this test can be used) for the duration of the  COVID-19 declaration under Section 564(b)(1) of the Act, 21 U.S.C.  section 360bbb-3(b)(1), unless the authorization is terminated or  revoked. Performed at [facility], [address] [place], [place] [address]     No results for input(s): LIPASE, AMYLASE in the last 168 hours. No results for input(s): AMMONIA in the last 168 hours.  Cardiac Enzymes: No results for input(s): CKTOTAL, CKMB, CKMBINDEX, TROPONINI in the last 168 hours. BNP (last 3 results) No results for input(s): BNP in the last 8760 hours.  ProBNP (last 3 results) No results for input(s): PROBNP in the last 8760 hours.  Studies:  EEG  Result Date: 08/08/2020 Charlsie Quest, MD     08/08/2020 11:11 AM Patient Name: Draden Cottingham MRN: [id number] Epilepsy Attending: Charlsie Quest Referring Physician/Provider: Dr Erick Blinks Date: 08/07/2020 Duration: 23. 48 minutes Patient history: 54 year old male with history of epilepsy who presented with acute onset right-sided weakness with intermittent aphasia.  EEG to evaluate for seizures. Level of alertness: Awake AEDs during EEG study: Keppra, Ativan Technical aspects: This EEG study was done with scalp electrodes positioned according to the 10-20 International system of electrode placement. Electrical activity was acquired at a sampling rate of  and reviewed with a high frequency filter of  and a low frequency filter of . EEG data were recorded continuously and digitally stored. Description: No clear posterior dominant rhythm was seen.   There is an excessive amount of 15 to 18 Hz beta activity distributed symmetrically and diffusely.  Hyperventilation and photic stimulation were not performed.   ABNORMALITY -Excessive beta, generalized IMPRESSION: This study is within normal limits. The excessive beta activity seen in the background is most likely due to the effect of benzodiazepine and is a benign EEG pattern. No seizures or epileptiform discharges were seen throughout the recording. Charlsie Quest   MR BRAIN WO CONTRAST  Result Date: 08/08/2020 CLINICAL DATA:  Acute right-sided weakness EXAM: MRI HEAD WITHOUT CONTRAST TECHNIQUE: Multiplanar, multiecho pulse sequences of the brain and surrounding structures were obtained without intravenous contrast. COMPARISON:  None. FINDINGS: Brain: No acute infarct, acute hemorrhage or extra-axial collection. Old left corona  radiata small vessel infarct. There is multifocal periventricular white matter hyperintensity, most often a result of chronic microvascular ischemia. There is generalized atrophy without lobar predilection. No chronic microhemorrhage. Normal midline structures. Vascular: Normal flow voids. Skull and upper cervical spine: Normal marrow signal. Sinuses/Orbits: Bilateral maxillary sinus mucosal thickening. Normal orbits. Other: None. IMPRESSION: 1. No acute intracranial abnormality. 2. Old left corona radiata small vessel infarct. Electronically Signed   By: Deatra Robinson M.D.   On: 08/08/2020 00:55   CT HEAD CODE STROKE WO CONTRAST  Result Date: 08/07/2020 CLINICAL DATA:  Code stroke. 54 year old male with right side weakness. EXAM: CT HEAD WITHOUT CONTRAST TECHNIQUE: Contiguous axial images were obtained from the base of the skull through the vertex without intravenous contrast. COMPARISON:  Head CT 04/23/2020.  Brain MRI 04/24/2020. FINDINGS: Brain: Chronic lacunar infarct of the left corona radiata and lentiform appears stable since July. Stable gray-white matter  differentiation elsewhere. Stable chronic encephalomalacia in the left amygdala. No midline shift, ventriculomegaly, mass effect, evidence of mass lesion, intracranial hemorrhage or evidence of cortically based acute infarction. Vascular: Calcified atherosclerosis at the skull base. No suspicious intracranial vascular hyperdensity. Skull: No acute osseous abnormality identified. Sinuses/Orbits: Increased fluid in the left maxillary sinus. Increased right maxillary mucosal thickening. Other visualized paranasal sinuses and mastoids are stable and well pneumatized. Other: No acute orbit or scalp soft tissue finding. ASPECTS [facility] Stroke Program Early CT Score) Total score (0-10 with 10 being normal): 10 (chronic encephalomalacia in the left hemisphere). IMPRESSION: 1. Stable non contrast CT appearance of the brain since July, with chronic small vessel disease in the left corona radiata and chronic left amygdala encephalomalacia. 2. No acute cortically based infarct or acute intracranial hemorrhage identified. ASPECTS 10. 3. These results were communicated to Dr. Derry Lory at 7:08 pmon 11/10/2021by text page via the [facility] messaging system. Electronically Signed   By: Odessa Fleming M.D.   On: 08/07/2020 19:09   CT ANGIO HEAD CODE STROKE  Result Date: 08/07/2020 CLINICAL DATA:  54 year old male code stroke presentation. Right side weakness. EXAM: CT ANGIOGRAPHY HEAD AND NECK TECHNIQUE: Multidetector CT imaging of the head and neck was performed using the standard protocol during bolus administration of intravenous contrast. Multiplanar CT image reconstructions and MIPs were obtained to evaluate the vascular anatomy. Carotid stenosis measurements (when applicable) are obtained utilizing NASCET criteria, using the distal internal carotid diameter as the denominator. CONTRAST:  75mL OMNIPAQUE IOHEXOL 350 MG/ML SOLN COMPARISON:  Plain head CT 1858 hours today. Brain MRI and intracranial MRA 04/24/2020. FINDINGS: CTA NECK  Skeleton: Prior sternotomy. Absent dentition. No acute osseous abnormality identified. Bilateral maxillary sinus disease again noted. Upper chest: Calcified coronary artery atherosclerosis. Visible central pulmonary arteries appear patent. Negative upper lungs. Other neck: Reflux of venous contrast throughout the right neck including in the right EJ and retromandibular vein to the level of the temporalis muscle. No other acute finding identified. Aortic arch: 4 vessel arch configuration, the left vertebral arises directly from the arch. Mild arch atherosclerosis. Prior CABG. Right carotid system: Negative brachiocephalic artery and right CCA. Mild soft and calcified plaque at the right carotid bifurcation without stenosis. Mildly tortuous cervical right ICA. Left carotid system: Mild plaque in the left CCA proximal to the bifurcation without stenosis. Moderate soft and calcified plaque at the left ICA origin, but no stenosis. Vertebral arteries: Mild plaque in the proximal right subclavian artery without stenosis. Right vertebral artery origin is patent. The right V1 segment is obscured by surrounding venous contrast and  streak artifact, but the visible right vertebral appears patent and normal to the skull base. The right vertebral artery is dominant. Non dominant left vertebral artery arises directly from the arch. The left vertebral remains diminutive but patent to the skull base. CTA HEAD Posterior circulation: Right vertebral supplies the basilar as on the July MRA. Normal right PICA origin. Left vertebral functionally terminates in PICA. No right vertebral or basilar artery stenosis. Patent SCA origins. Fetal type right PCA origin again noted. Normal left PCA origin with diminutive left posterior communicating artery and stable moderate left P1/P2 stenosis (series 10, image 121). Mild to moderate right P2 and P3 segment irregularity and stenosis appears stable on series 11, image 20. Anterior circulation: Both  ICA siphons are patent. Moderate calcified plaque on the left but no left siphon stenosis. Similar moderate plaque on the right with mild distal petrous stenosis only. Normal right posterior communicating artery origin. Normal ophthalmic artery origins. Patent carotid termini. Dominant left and diminutive or absent right ACA A1 segment with ectatic anterior communicating artery as before. The anterior communicating also appears mildly fenestrated. There is no discrete saccular aneurysm. ACAs appears stable since July. Left MCA M1 remains patent with stable mild to moderate irregularity and stenosis on series 10, image 17. Left MCA trifurcation and left MCA branches are patent and appear stable. Right MCA M1 segment is stable with mild irregularity. Patent right MCA trifurcation. Right MCA branches are stable and within normal limits. Venous sinuses: Early contrast timing, not well evaluated. Anatomic variants: Dominant right vertebral artery supplies the basilar. Non dominant left vertebral arises from the arch and functionally terminates in PICA. Dominant left and diminutive or absent right ACA A1 segments. Review of the MIP images confirms the above findings IMPRESSION: 1. Negative for large vessel occlusion. 2. Stable since the July MRA, with intracranial atherosclerosis and moderate stenoses of the Left MCA M1, Left PCA P1/P2. 3. Cervical Carotid atherosclerosis without significant stenosis. 4. Prior CABG. These results were communicated to Dr. Erick Blinks at 7:38 pmon 11/10/2021by text page via the [facility] messaging system. Electronically Signed   By: Odessa Fleming M.D.   On: 08/07/2020 19:38   CT ANGIO NECK CODE STROKE  Result Date: 08/07/2020 CLINICAL DATA:  54 year old male code stroke presentation. Right side weakness. EXAM: CT ANGIOGRAPHY HEAD AND NECK TECHNIQUE: Multidetector CT imaging of the head and neck was performed using the standard protocol during bolus administration of intravenous contrast.  Multiplanar CT image reconstructions and MIPs were obtained to evaluate the vascular anatomy. Carotid stenosis measurements (when applicable) are obtained utilizing NASCET criteria, using the distal internal carotid diameter as the denominator. CONTRAST:  82mL OMNIPAQUE IOHEXOL 350 MG/ML SOLN COMPARISON:  Plain head CT 1858 hours today. Brain MRI and intracranial MRA 04/24/2020. FINDINGS: CTA NECK Skeleton: Prior sternotomy. Absent dentition. No acute osseous abnormality identified. Bilateral maxillary sinus disease again noted. Upper chest: Calcified coronary artery atherosclerosis. Visible central pulmonary arteries appear patent. Negative upper lungs. Other neck: Reflux of venous contrast throughout the right neck including in the right EJ and retromandibular vein to the level of the temporalis muscle. No other acute finding identified. Aortic arch: 4 vessel arch configuration, the left vertebral arises directly from the arch. Mild arch atherosclerosis. Prior CABG. Right carotid system: Negative brachiocephalic artery and right CCA. Mild soft and calcified plaque at the right carotid bifurcation without stenosis. Mildly tortuous cervical right ICA. Left carotid system: Mild plaque in the left CCA proximal to the bifurcation without stenosis. Moderate  soft and calcified plaque at the left ICA origin, but no stenosis. Vertebral arteries: Mild plaque in the proximal right subclavian artery without stenosis. Right vertebral artery origin is patent. The right V1 segment is obscured by surrounding venous contrast and streak artifact, but the visible right vertebral appears patent and normal to the skull base. The right vertebral artery is dominant. Non dominant left vertebral artery arises directly from the arch. The left vertebral remains diminutive but patent to the skull base. CTA HEAD Posterior circulation: Right vertebral supplies the basilar as on the July MRA. Normal right PICA origin. Left vertebral  functionally terminates in PICA. No right vertebral or basilar artery stenosis. Patent SCA origins. Fetal type right PCA origin again noted. Normal left PCA origin with diminutive left posterior communicating artery and stable moderate left P1/P2 stenosis (series 10, image 121). Mild to moderate right P2 and P3 segment irregularity and stenosis appears stable on series 11, image 20. Anterior circulation: Both ICA siphons are patent. Moderate calcified plaque on the left but no left siphon stenosis. Similar moderate plaque on the right with mild distal petrous stenosis only. Normal right posterior communicating artery origin. Normal ophthalmic artery origins. Patent carotid termini. Dominant left and diminutive or absent right ACA A1 segment with ectatic anterior communicating artery as before. The anterior communicating also appears mildly fenestrated. There is no discrete saccular aneurysm. ACAs appears stable since July. Left MCA M1 remains patent with stable mild to moderate irregularity and stenosis on series 10, image 17. Left MCA trifurcation and left MCA branches are patent and appear stable. Right MCA M1 segment is stable with mild irregularity. Patent right MCA trifurcation. Right MCA branches are stable and within normal limits. Venous sinuses: Early contrast timing, not well evaluated. Anatomic variants: Dominant right vertebral artery supplies the basilar. Non dominant left vertebral arises from the arch and functionally terminates in PICA. Dominant left and diminutive or absent right ACA A1 segments. Review of the MIP images confirms the above findings IMPRESSION: 1. Negative for large vessel occlusion. 2. Stable since the July MRA, with intracranial atherosclerosis and moderate stenoses of the Left MCA M1, Left PCA P1/P2. 3. Cervical Carotid atherosclerosis without significant stenosis. 4. Prior CABG. These results were communicated to Dr. Erick Blinks at 7:38 pmon 11/10/2021by text page via the  [facility] messaging system. Electronically Signed   By: Odessa Fleming M.D.   On: 08/07/2020 19:38       Maressa Apollo S Pualani Borah   [facility] If 7PM-7AM, please contact night-coverage at [email or web address], Office  [phone]   08/08/2020, 1:44 PM  LOS: 0 days

## 2020-08-08 NOTE — ED Notes (Signed)
Pt standing in door of room yelling "hello!". Went to pt room, pt is very agitated, yelling, upset because he urinated in sink and could not find call bell. Attempted to explain to pt that I had been in the room twice since being his nurse at 0300 and he had been asleep with urinal & call bell next to him, and that maybe phlebotomy moved the table w urinal while they were getting blood. Pt continued to yell, demanding to speak to an administrator for "all the trouble he's been through in the ED", yelled at this RN to go "waddle off, you cunt!". Asked pt to not speak to staff like that, and he said "I can do whatever I want". Charge RN made aware and security called.

## 2020-08-08 NOTE — TOC Initial Note (Signed)
Transition of Care [facility]) - Initial/Assessment Note    Patient Details  Name: Jeremy Lang MRN: [id number] Date of Birth: January 04, 1966  Transition of Care [facility] CM/SW Contact:    Baldemar Lenis, LCSW Phone Number: 08/08/2020, 2:41 PM  Clinical Narrative:     CSW spoke with patient to discuss recommendation for SNF. Patient in agreement, and hopeful for placement as close to his apartment as possible. Patient has received both vaccines and booster shot. CSW received permission to fax out referral and will follow up with bed offers.              Expected Discharge Plan: Skilled Nursing Facility Barriers to Discharge: English as a second language teacher, Continued Medical Work up, Engineer, mining)   Patient Goals and CMS Choice Patient states their goals for this hospitalization and ongoing recovery are:: to get rehab CMS Medicare.gov Compare Post Acute Care list provided to:: Patient Choice offered to / list presented to : Patient  Expected Discharge Plan and Services Expected Discharge Plan: Skilled Nursing Facility     Post Acute Care Choice: Skilled Nursing Facility Living arrangements for the past 2 months: Apartment                                      Prior Living Arrangements/Services Living arrangements for the past 2 months: Apartment Lives with:: Self Patient language and need for interpreter reviewed:: No Do you feel safe going back to the place where you live?: Yes      Need for Family Participation in Patient Care: No (Comment) Care giver support system in place?: No (comment)   Criminal Activity/Legal Involvement Pertinent to Current Situation/Hospitalization: No - Comment as needed  Activities of Daily Living Home Assistive Devices/Equipment: None ADL Screening (condition at time of admission) Patient's cognitive ability adequate to safely complete daily activities?: Yes Is the patient deaf or have difficulty hearing?: No Does the patient  have difficulty seeing, even when wearing glasses/contacts?: No Does the patient have difficulty concentrating, remembering, or making decisions?: No Patient able to express need for assistance with ADLs?: Yes Does the patient have difficulty dressing or bathing?: Yes Independently performs ADLs?: Yes (appropriate for developmental age) Does the patient have difficulty walking or climbing stairs?: Yes Weakness of Legs: Right Weakness of Arms/Hands: Right  Permission Sought/Granted Permission sought to share information with : Oceanographer granted to share information with : Yes, Verbal Permission Granted     Permission granted to share info w AGENCY: SNF        Emotional Assessment   Attitude/Demeanor/Rapport: Engaged Affect (typically observed): Appropriate Orientation: : Oriented to Self, Oriented to Place, Oriented to  Time, Oriented to Situation Alcohol / Substance Use: Not Applicable    Admission diagnosis:  Slurred speech [R47.81] Seizure (HCC) [R56.9] Weakness [R53.1] Acute right-sided weakness [R53.1] Patient Active Problem List   Diagnosis Date Noted  . Uncontrolled type 2 diabetes with neuropathy (HCC)   . 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 Acute right-sided weakness 08/07/2020  . Diabetes mellitus without complication (HCC)   . Seizure-like activity (HCC)    PCP:  Patient, No Pcp Per Pharmacy:   [id number] [place],  - [address] AT [facility] [address] & [facility] [address] [place] [address] Phone: [phone] Fax: [phone]  [facility] [place],  [place] - [address] [place] [address] Phone: [phone] Fax: [phone]     Social Determinants of Health (SDOH) Interventions    Readmission Risk Interventions No flowsheet data found.

## 2020-08-08 NOTE — Progress Notes (Signed)
Neurology Progress Note  S: right sided weakness and ? seizure activity.   HPI: Pt presented to ED last evening stating he had right sided weakness with dysarthria at home. Hx of old stroke so he came to the ED. While in ED/CT scanner, pt had seizure like activity and received 1gm Keppra IV and Ativan IV. Neuro consulted last evening and ordered MRI brain and EEG. Neuro consult also mentioned possibly psychogenic component.    O: Current vital signs: BP 138/86 (BP Location: Left Arm)   Pulse 67   Temp 98.5 F (36.9 C) (Oral)   Resp 13   Ht 5\' 7"  (1.702 m)   Wt 106.6 kg   SpO2 99%   BMI 36.81 kg/m  Vital signs in last 24 hours: Temp:  [97.7 F (36.5 C)-98.8 F (37.1 C)] 98.5 F (36.9 C) (11/11 1000) Pulse Rate:  [64-76] 67 (11/11 1000) Resp:  [13-25] 13 (11/11 1000) BP: (125-174)/(75-104) 138/86 (11/11 1000) SpO2:  [95 %-100 %] 99 % (11/11 1000) Weight:  [106.6 kg] 106.6 kg (11/10 1932) Exam: The podiatrist was present at the bedside examining pt's toe. Pt was awake, alert, oriented x 4 and interacting appropriately. Speech clear, fluent, and appropriate. Able to answer questions. No facial droop noted. MOE x 4. Medications  Current Facility-Administered Medications:  .  acetaminophen (TYLENOL) tablet 650 mg, 650 mg, Oral, Q4H PRN **OR** acetaminophen (TYLENOL) 160 MG/5ML solution 650 mg, 650 mg, Per Tube, Q4H PRN **OR** acetaminophen (TYLENOL) suppository 650 mg, 650 mg, Rectal, Q4H PRN, Patel, Vishal R, MD .  enoxaparin (LOVENOX) injection 40 mg, 40 mg, Subcutaneous, Q24H, 11-10-2004 R, MD, 40 mg at 08/08/20 0935 .  [START ON 08/09/2020] influenza vac split quadrivalent PF (FLUARIX) injection 0.5 mL, 0.5 mL, Intramuscular, Tomorrow-1000, Lama, Gagan S, MD .  insulin aspart (novoLOG) injection 0-9 Units, 0-9 Units, Subcutaneous, TID WC, 10-18-1975, MD, 3 Units at 08/08/20 [phone] .  levETIRAcetam (KEPPRA) IVPB 1000 mg/100 mL premix, 1,000 mg, Intravenous, Once, 3149, MD, Held at 08/07/20 1938 .  [START ON 08/09/2020] pneumococcal 23 valent vaccine (PNEUMOVAX-23) injection 0.5 mL, 0.5 mL, Intramuscular, Tomorrow-1000, 13/08/2020, Sharl Ma, MD .  senna-docusate (Senokot-S) tablet 1 tablet, 1 tablet, Oral, QHS PRN, Sarina Ill, MD Labs CBC    Component Value Date/Time   WBC 10.4 08/07/2020 1857   RBC 5.41 08/07/2020 1857   HGB 16.0 08/07/2020 1858   HCT 47.0 08/07/2020 1858   PLT 202 08/07/2020 1857   MCV 86.9 08/07/2020 1857   MCH 28.5 08/07/2020 1857   MCHC 32.8 08/07/2020 1857   RDW 13.5 08/07/2020 1857   LYMPHSABS 2.8 08/07/2020 1857   MONOABS 0.7 08/07/2020 1857   EOSABS 0.1 08/07/2020 1857   BASOSABS 0.1 08/07/2020 1857    CMP     Component Value Date/Time   NA 137 08/07/2020 1858   K 3.7 08/07/2020 1858   CL 99 08/07/2020 1858   CO2 25 08/07/2020 1857   GLUCOSE 68 (L) 08/07/2020 1858   BUN 15 08/07/2020 1858   CREATININE 1.00 08/07/2020 1858   CALCIUM 8.9 08/07/2020 1857   PROT 6.8 08/07/2020 1857   ALBUMIN 3.3 (L) 08/07/2020 1857   AST 18 08/07/2020 1857   ALT 18 08/07/2020 1857   ALKPHOS 84 08/07/2020 1857   BILITOT 1.1 08/07/2020 1857   GFRNONAA >60 08/07/2020 1857   GFRAA >60 05/27/2020 1127    glycosylated hemoglobin  Lipid Panel     Component Value Date/Time  CHOL 224 (H) 08/08/2020 0346   TRIG 114 08/08/2020 0346   HDL 54 08/08/2020 0346   CHOLHDL 4.1 08/08/2020 0346   VLDL 23 08/08/2020 0346   LDLCALC 147 (H) 08/08/2020 0346     Imaging I have reviewed images in epic and the results pertinent to this consultation are: CT Head showed no acute abnormality. MRI Brain showed no acute abnormality. Old infarct noted left corona radiata small vessel infarct.   Assessment: 54 yo male with PMHx as listed and an old hemorrhagic stroke. In review of chart, labs, tests, etc. It was noted that pt needs stringent secondary stroke prevention.   Impression: Right sided weakness with dysarthria. No acute infarct or  hemorrhage on CT or MRI. Evidence of old hemorrhagic infarct as above. Seizure activity suspected to be psychogenic.   Recommendations: Tight cholesterol and DM control. Needs PCP and psychiatrist for chronic illness management. EEG neg and MRI neg for new issue. No further neurology recommendations at this time. Pt can be discharged from our standpoint. No antiplatets given hx hemorrhagic stroke.   Jimmye Norman, MSN, APN-BC/Nurse Practitioner Pager: [phone]

## 2020-08-08 NOTE — Progress Notes (Signed)
Name: Jeremy Lang DOB: 1966-07-12  Please be advised that the above-named patient will require a short-term nursing home stay -- anticipated 30 days or less for rehabilitation and strengthening. The plan is for return home.

## 2020-08-08 NOTE — Progress Notes (Signed)
Received pt from the ED, alert and oriented X4, MD orders implemented, placed on tele and verified, pt had an episode of vomit but stated that he does like that when he has not eaten, pt was given sandwich and he was okay, he did not want any antiemetic meds, will continue to monitor, personal items in reach, call light in reach

## 2020-08-09 LAB — GLUCOSE, CAPILLARY
Glucose-Capillary: 248 mg/dL — ABNORMAL HIGH (ref 70–99)
Glucose-Capillary: 238 mg/dL — ABNORMAL HIGH (ref 70–99)
Glucose-Capillary: 229 mg/dL — ABNORMAL HIGH (ref 70–99)
Glucose-Capillary: 200 mg/dL — ABNORMAL HIGH (ref 70–99)

## 2020-08-09 MED ORDER — INSULIN ASPART 100 UNIT/ML ~~LOC~~ SOLN: 0.0000 [IU] | Freq: Three times a day (TID) | SUBCUTANEOUS | Status: DC

## 2020-08-09 MED ORDER — INSULIN GLARGINE 100 UNIT/ML ~~LOC~~ SOLN
15.0000 [IU] | Freq: Every day | SUBCUTANEOUS | Status: DC
  Administered 2020-08-09 – 2020-08-12 (×4): 15 [IU] via SUBCUTANEOUS
  Filled 2020-08-09 (×5): qty 0.15

## 2020-08-09 MED ORDER — INSULIN ASPART 100 UNIT/ML ~~LOC~~ SOLN
0.0000 [IU] | Freq: Every day | SUBCUTANEOUS | Status: DC
  Administered 2020-08-11: 4 [IU] via SUBCUTANEOUS
  Administered 2020-08-12 – 2020-08-15 (×3): 3 [IU] via SUBCUTANEOUS

## 2020-08-09 MED ORDER — INSULIN ASPART 100 UNIT/ML ~~LOC~~ SOLN
0.0000 [IU] | Freq: Three times a day (TID) | SUBCUTANEOUS | Status: DC
  Administered 2020-08-09 – 2020-08-10 (×2): 5 [IU] via SUBCUTANEOUS
  Administered 2020-08-10: 2 [IU] via SUBCUTANEOUS
  Administered 2020-08-10: 8 [IU] via SUBCUTANEOUS
  Administered 2020-08-11 (×2): 3 [IU] via SUBCUTANEOUS
  Administered 2020-08-11: 5 [IU] via SUBCUTANEOUS
  Administered 2020-08-12: 3 [IU] via SUBCUTANEOUS
  Administered 2020-08-12: 5 [IU] via SUBCUTANEOUS
  Administered 2020-08-12: 3 [IU] via SUBCUTANEOUS
  Administered 2020-08-13: 11 [IU] via SUBCUTANEOUS
  Administered 2020-08-13: 8 [IU] via SUBCUTANEOUS
  Administered 2020-08-13: 5 [IU] via SUBCUTANEOUS
  Administered 2020-08-14: 3 [IU] via SUBCUTANEOUS
  Administered 2020-08-14: 8 [IU] via SUBCUTANEOUS
  Administered 2020-08-14 – 2020-08-15 (×3): 5 [IU] via SUBCUTANEOUS
  Administered 2020-08-15: 3 [IU] via SUBCUTANEOUS
  Administered 2020-08-16: 5 [IU] via SUBCUTANEOUS

## 2020-08-09 NOTE — Progress Notes (Signed)
After speaking with the patient yesterday CM has sent pts information to [facility] on [address] for them to review and call him for a PCP appt.  If pt d/c to SNF he wont need a PCP appt until after d/c from rehab.

## 2020-08-09 NOTE — Progress Notes (Addendum)
Inpatient Diabetes Program Recommendations  AACE/ADA: New Consensus Statement on Inpatient Glycemic Control (2015)  Target Ranges:  Prepandial:   less than 140 mg/dL      Peak postprandial:   less than 180 mg/dL (1-2 hours)      Critically ill patients:  140 - 180 mg/dL   Lab Results  Component Value Date   GLUCAP 248 (H) 08/09/2020   HGBA1C 8.2 (H) 08/08/2020  Results for ALTO, GANDOLFO (MRN [id number]) as of 08/09/2020 09:49  Ref. Range 08/08/2020 06:03 08/08/2020 11:42 08/08/2020 15:31 08/08/2020 20:51 08/09/2020 06:04  Glucose-Capillary Latest Ref Range: 70 - 99 mg/dL 741 (H) 638 (H) 453 (H) 266 (H) 248 (H)    Review of Glycemic Control  Diabetes history:type 2 Outpatient Diabetes medications: Levemir 20 units BID, Novolog 16-22 units TID PRN Current orders for Inpatient glycemic control: Lantus 5 units daily, Novolog SENSITIVE correction scale TID.  Inpatient Diabetes Program Recommendations:   Noted that blood sugars continue to be greater than 180 mg/dl. Recommend increasing Lantus to 15 units daily and increasing Novolog to MODERATE correction scale TID if blood sugars continue to be elevated. Titrate dosages as needed.   Smith Mince RN BSN CDE Diabetes Coordinator Pager: [phone]  8am-5pm

## 2020-08-09 NOTE — Consult Note (Signed)
[facility] Face-to-Face Psychiatry Consult   Reason for Consult:  Psychogenic component, seizure disorder, schizoaffective disorder Referring Physician:  Dr. 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 male patient admitted with right-sided weakness.   Patient is a 54 year old male who appears stated age, friendly and cooperative, very jovial throughout the evaluation.  Patient expresses some frustration and appears upset regarding his medical team suspecting he has psychogenic component and or conversion disorder.  He is very tangential in his thinking, and appears to have some rumination as well.  Patient was appropriate and answered all questions accordingly.  He did not exhibit any ideas of reference, preoccupied thoughts.  He does state" I can run up and down the hall, naked, and drooling and screaming if you want me to.  But I am not 1 of those psychotic patients that they on labeling me to be."  He denies any previous psychiatric diagnosis, denies any previous psychotropic medication.  He reports his current medication he is taking is for a seizure disorder not a psychotic disorder.  He denies any previous suicide attempts.  He denies any previous inpatient admission.  And at the time of this evaluation he denies any current suicidal ideations, homicidal ideations, and or hallucinations.  HPI:  54 year old male with history of diabetes mellitus type 2, CAD s/p CABG, seizures, prior hemorrhagic liver infarct, schizoaffective disorder came to ED for evaluation of acute  right-sided weakness.  Patient states that he had new onset right upper and lower extremity weakness with difficulty speaking.  CT head showed no acute infarct.  CTA head and neck was negative for large vessel occlusion.  Neurology was consulted, MRI brain and EEG were recommended.  Past Psychiatric History: Patient denies any previous psychiatric history.  Per chart review previous admission in August 2020, patient presented with worsening of symptoms consistent with schizoaffective disorder bipolar type, in which she presented with auditory hallucinations and delusions.  He was treated with aripiprazole 5 mg p.o. twice daily, duloxetine 30 mg p.o. daily with cessation of auditory hallucinations, passive suicidal ideations, and delusions.  During this admission he was transferred to the medical unit for further evaluation of encephalopathy and confusion, and his assessment did not appear consistent with schizoaffective disorder.  Risk to Self:  Denies Risk to Others:  Denies Prior Inpatient Therapy:  Denies, although chart review shows psychiatric admission 1 year ago while in [place]. Prior Outpatient Therapy:  Denies  Past Medical History:  Past Medical History:  Diagnosis Date  . Diabetes mellitus without complication (HCC)   . Seizures (HCC)   . Stroke [facility])     Past Surgical History:  Procedure Laterality Date  . CORONARY ARTERY BYPASS GRAFT  2009 per pt   Family History:  Family History  Problem Relation Age of Onset  . Heart disease Mother   . Diabetes Mother   . Heart disease Father   . Diabetes Father    Family Psychiatric  History: Denies Social History:  Social History   Substance and Sexual Activity  Alcohol Use Not Currently     Social History  Substance and Sexual Activity  Drug Use Not Currently    Social History   Socioeconomic History  . Marital status: Widowed    Spouse name: Not on file  . Number of children: Not on file  . Years of education: Not  on file  . Highest education level: Not on file  Occupational History  . Not on file  Tobacco Use  . Smoking status: Never Smoker  . Smokeless tobacco: Never Used  Substance and Sexual Activity  . Alcohol use: Not Currently  . Drug use: Not Currently  . Sexual activity: Not on file  Other Topics Concern  . Not on file  Social History Narrative  . Not on file   Social Determinants of Health   Financial Resource Strain:   . Difficulty of Paying Living Expenses: Not on file  Food Insecurity:   . Worried About Programme researcher, broadcasting/film/video in the Last Year: Not on file  . Ran Out of Food in the Last Year: Not on file  Transportation Needs:   . Lack of Transportation (Medical): Not on file  . Lack of Transportation (Non-Medical): Not on file  Physical Activity:   . Days of Exercise per Week: Not on file  . Minutes of Exercise per Session: Not on file  Stress:   . Feeling of Stress : Not on file  Social Connections:   . Frequency of Communication with Friends and Family: Not on file  . Frequency of Social Gatherings with Friends and Family: Not on file  . Attends Religious Services: Not on file  . Active Member of Clubs or Organizations: Not on file  . Attends Banker Meetings: Not on file  . Marital Status: Not on file   Additional Social History:    Allergies:  No Known Allergies  Labs:  Results for orders placed or performed during the hospital encounter of 08/07/20 (from the past 48 hour(s))  CBG monitoring, ED     Status: None   Collection Time: 08/07/20  6:53 PM  Result Value Ref Range   Glucose-Capillary 95 70 - 99 mg/dL    Comment: Glucose reference range applies only to samples taken after fasting for at least 8 hours.  Protime-INR     Status: None   Collection Time: 08/07/20  6:57 PM  Result Value Ref Range   Prothrombin Time 11.9 11.4 - 15.2 seconds   INR 0.9 0.8 - 1.2    Comment: (NOTE) INR goal varies based on device and disease states. Performed at  [facility], [address] [place], [place] [address]   APTT     Status: None   Collection Time: 08/07/20  6:57 PM  Result Value Ref Range   aPTT 25 24 - 36 seconds    Comment: Performed at [facility], [address] [place], [place] [address]  CBC     Status: None   Collection Time: 08/07/20  6:57 PM  Result Value Ref Range   WBC 10.4 4.0 - 10.5 K/uL   RBC 5.41 4.22 - 5.81 MIL/uL   Hemoglobin 15.4 13.0 - 17.0 g/dL   HCT 94.1 39 - 52 %   MCV 86.9 80.0 - 100.0 fL   MCH 28.5 26.0 - 34.0 pg   MCHC 32.8 30.0 - 36.0 g/dL   RDW 74.0 81.4 - 48.1 %   Platelets 202 150 - 400 K/uL   nRBC 0.0 0.0 - 0.2 %    Comment: Performed at  [facility], [address] [place], [place], [place] [address]  Differential     Status: None   Collection Time: 08/07/20  6:57 PM  Result Value Ref Range   Neutrophils Relative % 64 %   Neutro Abs 6.6 1.7 - 7.7 K/uL   Lymphocytes Relative 27 %   Lymphs Abs 2.8 0.7 - 4.0 K/uL   Monocytes Relative 7 %   Monocytes Absolute 0.7 0.1 - 1.0 K/uL   Eosinophils Relative 1 %   Eosinophils Absolute 0.1 0.0 - 0.5 K/uL   Basophils Relative 1 %   Basophils Absolute 0.1 0.0 - 0.1 K/uL   Immature Granulocytes 0 %   Abs Immature Granulocytes 0.04 0.00 - 0.07 K/uL    Comment: Performed at [facility], [address] [place], [place] [address]  Comprehensive metabolic panel     Status: Abnormal   Collection Time: 08/07/20  6:57 PM  Result Value Ref Range   Sodium 135 135 - 145 mmol/L   Potassium 3.8 3.5 - 5.1 mmol/L   Chloride 101 98 - 111 mmol/L   CO2 25 22 - 32 mmol/L   Glucose, Bld 72 70 - 99 mg/dL    Comment: Glucose reference range applies only to samples taken after fasting for at least 8 hours.   BUN 13 6 - 20 mg/dL   Creatinine, Ser 9.62 0.61 - 1.24 mg/dL   Calcium 8.9 8.9 - 95.2 mg/dL   Total Protein 6.8 6.5 - 8.1 g/dL   Albumin 3.3 (L) 3.5 - 5.0 g/dL   AST 18 15 - 41 U/L   ALT 18 0 - 44 U/L   Alkaline Phosphatase 84 38 - 126 U/L    Total Bilirubin 1.1 0.3 - 1.2 mg/dL   GFR, Estimated >84 >13 mL/min    Comment: (NOTE) Calculated using the CKD-EPI Creatinine Equation (2021)    Anion gap 9 5 - 15    Comment: Performed at [facility], [address] [place], [place] [address]  I-stat chem 8, ED     Status: Abnormal   Collection Time: 08/07/20  6:58 PM  Result Value Ref Range   Sodium 137 135 - 145 mmol/L   Potassium 3.7 3.5 - 5.1 mmol/L   Chloride 99 98 - 111 mmol/L   BUN 15 6 - 20 mg/dL   Creatinine, Ser 0.27 0.61 - 1.24 mg/dL   Glucose, Bld 68 (L) 70 - 99 mg/dL    Comment: Glucose reference range applies only to samples taken after fasting for at least 8 hours.   Calcium, Ion 1.08 (L) 1.15 - 1.40 mmol/L   TCO2 24 22 - 32 mmol/L   Hemoglobin 16.0 13.0 - 17.0 g/dL   HCT 25.3 39 - 52 %  Resp Panel by RT PCR (RSV, Flu A&B, Covid) - Nasopharyngeal Swab     Status: None   Collection Time: 08/07/20  7:43 PM   Specimen: Nasopharyngeal Swab  Result Value Ref Range   SARS Coronavirus 2 by RT PCR NEGATIVE NEGATIVE    Comment: (NOTE) SARS-CoV-2 target nucleic acids are NOT DETECTED.  The SARS-CoV-2 RNA is generally detectable in upper respiratoy specimens during the acute phase of infection. The lowest concentration of SARS-CoV-2 viral copies this assay can detect is 131 copies/mL. A negative result does not preclude SARS-Cov-2 infection and should not be used as the sole basis for treatment or other patient management decisions. A negative result may occur with  improper specimen collection/handling, submission  of specimen other than nasopharyngeal swab, presence of viral mutation(s) within the areas targeted by this assay, and inadequate number of viral copies (<131 copies/mL). A negative result must be combined with clinical observations, patient history, and epidemiological information. The expected result is Negative.  Fact Sheet for Patients:  https://www.moore.com/  Fact Sheet  for Healthcare Providers:  https://www.young.biz/  This test is no t yet approved or cleared by the [place] FDA and  has been authorized for detection and/or diagnosis of SARS-CoV-2 by FDA under an Emergency Use Authorization (EUA). This EUA will remain  in effect (meaning this test can be used) for the duration of the COVID-19 declaration under Section 564(b)(1) of the Act, 21 U.S.C. section 360bbb-3(b)(1), unless the authorization is terminated or revoked sooner.     Influenza A by PCR NEGATIVE NEGATIVE   Influenza B by PCR NEGATIVE NEGATIVE    Comment: (NOTE) The Xpert Xpress SARS-CoV-2/FLU/RSV assay is intended as an aid in  the diagnosis of influenza from Nasopharyngeal swab specimens and  should not be used as a sole basis for treatment. Nasal washings and  aspirates are unacceptable for Xpert Xpress SARS-CoV-2/FLU/RSV  testing.  Fact Sheet for Patients: https://www.moore.com/  Fact Sheet for Healthcare Providers: https://www.young.biz/  This test is not yet approved or cleared by the [place] FDA and  has been authorized for detection and/or diagnosis of SARS-CoV-2 by  FDA under an Emergency Use Authorization (EUA). This EUA will remain  in effect (meaning this test can be used) for the duration of the  Covid-19 declaration under Section 564(b)(1) of the Act, 21  U.S.C. section 360bbb-3(b)(1), unless the authorization is  terminated or revoked.    Respiratory Syncytial Virus by PCR NEGATIVE NEGATIVE    Comment: (NOTE) Fact Sheet for Patients: https://www.moore.com/  Fact Sheet for Healthcare Providers: https://www.young.biz/  This test is not yet approved or cleared by the [place] FDA and  has been authorized for detection and/or diagnosis of SARS-CoV-2 by  FDA under an Emergency Use Authorization (EUA). This EUA will remain  in effect (meaning this test  can be used) for the duration of the  COVID-19 declaration under Section 564(b)(1) of the Act, 21 U.S.C.  section 360bbb-3(b)(1), unless the authorization is terminated or  revoked. Performed at [facility], [address], [place], [place] [address]   HIV Antibody (routine testing w rflx)     Status: None   Collection Time: 08/08/20  3:46 AM  Result Value Ref Range   HIV Screen 4th Generation wRfx Non Reactive Non Reactive    Comment: Performed at [facility], [address], [place], [place] [address]  Hemoglobin A1c     Status: Abnormal   Collection Time: 08/08/20  3:46 AM  Result Value Ref Range   Hgb A1c MFr Bld 8.2 (H) 4.8 - 5.6 %    Comment: (NOTE) Pre diabetes:          5.7%-6.4%  Diabetes:              >6.4%  Glycemic control for   <7.0% adults with diabetes    Mean Plasma Glucose 188.64 mg/dL    Comment: Performed at [facility], [address] [place], [place] [address]  Lipid panel     Status: Abnormal   Collection Time: 08/08/20  3:46 AM  Result Value Ref Range   Cholesterol 224 (H) 0 - 200 mg/dL   Triglycerides 914 <782 mg/dL   HDL 54 >  40 mg/dL   Total CHOL/HDL Ratio 4.1 RATIO   VLDL 23 0 - 40 mg/dL   LDL Cholesterol 454 (H) 0 - 99 mg/dL    Comment:        Total Cholesterol/HDL:CHD Risk Coronary Heart Disease Risk Table                     Men   Women  1/2 Average Risk   3.4   3.3  Average Risk       5.0   4.4  2 X Average Risk   9.6   7.1  3 X Average Risk  23.4   11.0        Use the calculated Patient Ratio above and the CHD Risk Table to determine the patient's CHD Risk.        ATP III CLASSIFICATION (LDL):  <100     mg/dL   Optimal  098-119  mg/dL   Near or Above                    Optimal  130-159  mg/dL   Borderline  147-829  mg/dL   High  >562     mg/dL   Very High Performed at [facility], [address], [place], [place] [address]   Urine rapid drug screen (hosp performed)     Status: None   Collection Time:  08/08/20  3:57 AM  Result Value Ref Range   Opiates NONE DETECTED NONE DETECTED   Cocaine NONE DETECTED NONE DETECTED   Benzodiazepines NONE DETECTED NONE DETECTED   Amphetamines NONE DETECTED NONE DETECTED   Tetrahydrocannabinol NONE DETECTED NONE DETECTED   Barbiturates NONE DETECTED NONE DETECTED    Comment: (NOTE) DRUG SCREEN FOR MEDICAL PURPOSES ONLY.  IF CONFIRMATION IS NEEDED FOR ANY PURPOSE, NOTIFY LAB WITHIN 5 DAYS.  LOWEST DETECTABLE LIMITS FOR URINE DRUG SCREEN Drug Class                     Cutoff (ng/mL) Amphetamine and metabolites    1000 Barbiturate and metabolites    200 Benzodiazepine                 200 Tricyclics and metabolites     300 Opiates and metabolites        300 Cocaine and metabolites        300 THC                            50 Performed at [facility], [address], [place], [place] [address]   Urinalysis, Routine w reflex microscopic Urine, Clean Catch     Status: Abnormal   Collection Time: 08/08/20  3:58 AM  Result Value Ref Range   Color, Urine YELLOW YELLOW   APPearance CLEAR CLEAR   Specific Gravity, Urine 1.044 (H) 1.005 - 1.030   pH 5.0 5.0 - 8.0   Glucose, UA >=500 (A) NEGATIVE mg/dL   Hgb urine dipstick SMALL (A) NEGATIVE   Bilirubin Urine NEGATIVE NEGATIVE   Ketones, ur NEGATIVE NEGATIVE mg/dL   Protein, ur >=962 (A) NEGATIVE mg/dL   Nitrite NEGATIVE NEGATIVE   Leukocytes,Ua NEGATIVE NEGATIVE   RBC / HPF 0-5 0 - 5 RBC/hpf   WBC, UA 0-5 0 - 5 WBC/hpf   Bacteria, UA RARE (A) NONE SEEN   Mucus PRESENT    Hyaline Casts, UA PRESENT  Comment: Performed at [facility], [address] [place], [place] [address]  Glucose, capillary     Status: Abnormal   Collection Time: 08/08/20  6:03 AM  Result Value Ref Range   Glucose-Capillary 209 (H) 70 - 99 mg/dL    Comment: Glucose reference range applies only to samples taken after fasting for at least 8 hours.   Comment 1 Notify RN    Comment 2 Document in Chart    Glucose, capillary     Status: Abnormal   Collection Time: 08/08/20 11:42 AM  Result Value Ref Range   Glucose-Capillary 221 (H) 70 - 99 mg/dL    Comment: Glucose reference range applies only to samples taken after fasting for at least 8 hours.  Glucose, capillary     Status: Abnormal   Collection Time: 08/08/20  3:31 PM  Result Value Ref Range   Glucose-Capillary 306 (H) 70 - 99 mg/dL    Comment: Glucose reference range applies only to samples taken after fasting for at least 8 hours.  Glucose, capillary     Status: Abnormal   Collection Time: 08/08/20  8:51 PM  Result Value Ref Range   Glucose-Capillary 266 (H) 70 - 99 mg/dL    Comment: Glucose reference range applies only to samples taken after fasting for at least 8 hours.   Comment 1 Notify RN    Comment 2 Document in Chart   Glucose, capillary     Status: Abnormal   Collection Time: 08/09/20  6:04 AM  Result Value Ref Range   Glucose-Capillary 248 (H) 70 - 99 mg/dL    Comment: Glucose reference range applies only to samples taken after fasting for at least 8 hours.   Comment 1 Notify RN    Comment 2 Document in Chart   Glucose, capillary     Status: Abnormal   Collection Time: 08/09/20 11:43 AM  Result Value Ref Range   Glucose-Capillary 238 (H) 70 - 99 mg/dL    Comment: Glucose reference range applies only to samples taken after fasting for at least 8 hours.    Current Facility-Administered Medications  Medication Dose Route Frequency Provider Last Rate Last Admin  . acetaminophen (TYLENOL) tablet 650 mg  650 mg Oral Q4H PRN Charlsie QuestPatel, Vishal R, MD   650 mg at 08/08/20 2228   Or  . acetaminophen (TYLENOL) 160 MG/5ML solution 650 mg  650 mg Per Tube Q4H PRN Charlsie QuestPatel, Vishal R, MD       Or  . acetaminophen (TYLENOL) suppository 650 mg  650 mg Rectal Q4H PRN Darreld McleanPatel, Vishal R, MD      . insulin aspart (novoLOG) injection 0-9 Units  0-9 Units Subcutaneous TID WC Charlsie QuestPatel, Vishal R, MD   3 Units at 08/09/20 1205  . insulin glargine  (LANTUS) injection 5 Units  5 Units Subcutaneous QHS Meredeth IdeLama, Gagan S, MD   5 Units at 08/08/20 2154  . metFORMIN (GLUCOPHAGE) tablet 500 mg  500 mg Oral BID WC Meredeth IdeLama, Gagan S, MD   500 mg at 08/09/20 0839  . mupirocin ointment (BACTROBAN) 2 %   Topical BID Edwin CapMcDonald, Adam R, DPM   Given at 08/09/20 [phone]  . polyethylene glycol (MIRALAX / GLYCOLAX) packet 17 g  17 g Oral Daily John Giovanniathore, Vasundhra, MD   17 g at 08/08/20 2154  . senna-docusate (Senokot-S) tablet 1 tablet  1 tablet Oral QHS PRN Darreld McleanPatel, Vishal R, MD      . senna-docusate (Senokot-S) tablet 1 tablet  1 tablet  Oral Nicolasa Ducking, MD   1 tablet at 08/08/20 2154    Musculoskeletal: Strength & Muscle Tone: within normal limits Gait & Station: normal Patient leans: N/A  Psychiatric Specialty Exam: Physical Exam Nursing note reviewed.  Constitutional:      Appearance: Normal appearance.  HENT:     Head: Normocephalic.  Skin:    General: Skin is warm and dry.  Neurological:     General: No focal deficit present.     Mental Status: He is alert and oriented to person, place, and time. Mental status is at baseline.  Psychiatric:        Mood and Affect: Mood normal.        Behavior: Behavior normal.        Thought Content: Thought content normal.        Judgment: Judgment normal.     Review of Systems  Psychiatric/Behavioral: Negative.   All other systems reviewed and are negative.   Blood pressure 137/78, pulse 62, temperature 97.7 F (36.5 C), temperature source Oral, resp. rate 18, height 5\' 7"  (1.702 m), weight 106.6 kg, SpO2 96 %.Body mass index is 36.81 kg/m.  General Appearance: Fairly Groomed and age stated  Eye Contact:  Good  Speech:  Clear and Coherent and Normal Rate  Volume:  Normal  Mood:  Euthymic  Affect:  Appropriate and Congruent  Thought Process:  Coherent, Linear and Descriptions of Associations: Circumstantial  Orientation:  Full (Time, Place, and Person)  Thought Content:  Logical and Tangential   Suicidal Thoughts:  No  Homicidal Thoughts:  No  Memory:  Immediate;   Good  Judgement:  Intact  Insight:  Present  Psychomotor Activity:  Normal  Concentration:  Concentration: Good and Attention Span: Fair  Recall:  of Knowledge:  Fair  Language:  Good  Akathisia:  No  Handed:  Right  AIMS (if indicated):     Assets:  Communication Skills Desire for Improvement Financial Resources/Insurance Housing Leisure Time Physical Health  ADL's:  Intact  Cognition:  WNL  Sleep:      54 year old male with documented history of schizoaffective disorder, bipolar type who presented to the emergency room with acute right-sided weakness.  On evaluation patient was alert and oriented, appropriate throughout the assessment, and denies any current symptoms of psychosis, mania, depressive symptoms, and or delusional thinking.  He does not appear to be responding to internal stimuli.  Although this can change from day to day, patient currently denies any symptoms of conversion disorder and or any additional underlying psychogenic conditions.  He denies any suicidal thoughts, homicidal thoughts, and or auditory visual hallucinations.  He appears to be engaging well with all staff, there is no evidence of him being verbally loud and aggressive, irritable, or showing poor impulse control.  He also denies taking any previous psychotropic medication.  Treatment Plan Summary: Plan May continue current medication.  Patient is encouraged to follow-up with outpatient psychiatry for further in-depth evaluation psychological evaluation.  At this time patient does not appear to to be exhibiting signs of schizoaffective disorder, delusional disorder, brief psychotic disorder, and or conversion disorder. Will psychiatrically cleared patient at this time.  Disposition: No evidence of imminent risk to self or others at present.   Patient does not meet criteria for psychiatric inpatient admission. Supportive  therapy provided about ongoing stressors.  56, FNP 08/09/2020 12:06 PM

## 2020-08-09 NOTE — Evaluation (Signed)
Speech Language Pathology Evaluation Patient Details Name: Jeremy Lang MRN: [id number] DOB: October 08, 1965 Today's Date: 08/09/2020 Time: 4103-0131 SLP Time Calculation (min) (ACUTE ONLY): 22 min  Problem List:  Patient Active Problem List   Diagnosis Date Noted  . Uncontrolled type 2 diabetes with neuropathy (HCC)   . 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 Acute right-sided weakness 08/07/2020  . Diabetes mellitus without complication (HCC)   . Seizure-like activity [facility])    Past Medical History:  Past Medical History:  Diagnosis Date  . Diabetes mellitus without complication (HCC)   . Seizures (HCC)   . Stroke [facility])    Past Surgical History:  Past Surgical History:  Procedure Laterality Date  . CORONARY ARTERY BYPASS GRAFT  2009 per pt   HPI:  54 y.o. male with medical history significant for type 2 diabetes, CAD s/p CABG, seizures, prior hemorrhagic lacunar infarct, schizoaffective disorder who presents to the ED for evaluation of acute right-sided weakness. CT head 08/08/20 indicated Stable chronic small vessel ischemic changes. No acute intracranial process  Assessment / Plan / Recommendation Clinical Impression  Pt seen for a speech/language/cognitive evaluation (SLUMS-[employer] [place] [facility] Mental Status Examination) with the following score elicited: 29/30 with 27/30 being a typical score for passing with this assessment.  Pt had difficulty with recalling words as he recalled 4/5 words after time constraint, but this is likely his baseline level of functioning.  Visual acuity affected graphic expression and reading acuity.  Pt wears glasses, but primary complaint is visual acuity at this point with the complaint of "blurry and dark" when attempting to read various signs/words during SLE.   Speech resolved from initial complaint of dysarthric speech with speech being judged to be 100% intelligible within conversation.  No ST needs identified at this  time.  ST will s/o services.     SLP Assessment  SLP Recommendation/Assessment: All further Speech Lanaguage Pathology  needs can be addressed in the next venue of care SLP Visit Diagnosis: Cognitive communication deficit (R41.841)    Follow Up Recommendations  Other (comment) (TBD)    Frequency and Duration     Evaluation only      SLP Evaluation Cognition  Overall Cognitive Status: History of cognitive impairments - at baseline Arousal/Alertness: Awake/alert Orientation Level: Oriented X4 Attention: Sustained Sustained Attention: Appears intact Memory: Impaired Memory Impairment: Retrieval deficit;Other (comment) (hx of deficits) Immediate Memory Recall: Sock;Blue;Bed Memory Recall Sock: Without Cue Memory Recall Blue: Without Cue Memory Recall Bed: With Cue Awareness: Appears intact Problem Solving: Appears intact Behaviors: Verbal agitation Safety/Judgment: Appears intact Comments: Pt stated he uses a walker at home, prepares simple meals or orders food       Comprehension  Auditory Comprehension Overall Auditory Comprehension: Appears within functional limits for tasks assessed Yes/No Questions: Within Functional Limits Commands: Within Functional Limits Conversation: Complex Visual Recognition/Discrimination Discrimination: Not tested Reading Comprehension Reading Status: Unable to assess (comment) (visual acuity affected)    Expression Expression Primary Mode of Expression: Verbal Verbal Expression Overall Verbal Expression: Appears within functional limits for tasks assessed Level of Generative/Spontaneous Verbalization: Conversation Repetition: No impairment Naming: No impairment Pragmatics: No impairment Non-Verbal Means of Communication: Not applicable Written Expression Dominant Hand: Right Written Expression: Unable to assess (comment) (visual acuity)   Oral / Motor  Oral Motor/Sensory Function Overall Oral Motor/Sensory Function: Within  functional limits Motor Speech Overall Motor Speech: Appears within functional limits for tasks assessed Respiration: Within functional limits Phonation: Normal Resonance: Within functional  limits Articulation: Within functional limitis Intelligibility: Intelligible Motor Planning: Witnin functional limits Motor Speech Errors: Not applicable                       Tressie Stalker, M.S., CCC-SLP 08/09/2020, 1:28 PM

## 2020-08-09 NOTE — Progress Notes (Signed)
[facility]  PROGRESS NOTE  Jeremy Lang DGL:[id number] DOB: 11/06/1965 DOA: 08/07/2020 PCP: Patient, No Pcp Per   Brief HPI:   54 year old male with history of diabetes mellitus type 2, CAD s/p CABG, seizures, prior hemorrhagic liver infarct, schizoaffective disorder came to ED for evaluation of acute right-sided weakness.  Patient states that he had new onset right upper and lower extremity weakness with difficulty speaking.  CT head showed no acute infarct.  CTA head and neck was negative for large vessel occlusion.  Neurology was consulted, MRI brain and EEG were recommended.    Subjective   Patient seen and examined, denies any complaints.   Assessment/Plan:     1. Right-sided weakness-patient's exam is not consistent, also documented by neurology that patient likely has psychogenic component.  MRI brain is negative for stroke.  Neurology has signed off. 2. Right diabetic foot ulcer-wound care was consulted, and recommended podiatry consultation.  Will consult podiatry for diabetic foot ulcer and also will need a better footwear to avoid possibility of infection and injury to limb loss. 3. Schizoaffective disorder-patient has schizoaffective disorder, currently on no medications.  He presented with psychogenic seizure/right-sided weakness.  Psychiatry was consulted, they did not feel that patient needs any acute therapy in the hospital.  Patient can follow-up outpatient psychiatry for in-depth evaluation.  Patient is cleared for discharge as per psychiatry.   4. CAD s/p CABG-patient reports prior CABG in 2009, currently on no medications. 5. Diabetes mellitus type 2-CBGs elevated, continue sensitive sliding scale insulin with NovoLog.  Hemoglobin A1c is 8.2.  Continue  Metformin 500 twice daily.  Will increase the dose of Lantus to 15 units subcu daily.   6. Seizure-like activity-patient had seizure-like activity in the ED while in the CT scanner.  He received IV Keppra and  Ativan.  Neurology saw the patient and it seems that he had a psychogenic seizure.  No further antiepileptic medications recommended.  EEG is negative for epileptiform discharges.     COVID-19 Labs  No results for input(s): DDIMER, FERRITIN, LDH, CRP in the last 72 hours.  Lab Results  Component Value Date   SARSCOV2NAA NEGATIVE 08/07/2020     Scheduled medications:   . insulin aspart  0-9 Units Subcutaneous TID WC  . insulin glargine  15 Units Subcutaneous QHS  . metFORMIN  500 mg Oral BID WC  . mupirocin ointment   Topical BID  . polyethylene glycol  17 g Oral Daily  . senna-docusate  1 tablet Oral QHS         CBG: Recent Labs  Lab 08/08/20 1142 08/08/20 1531 08/08/20 2051 08/09/20 0604 08/09/20 1143  GLUCAP 221* 306* 266* 248* 238*    SpO2: 100 %    CBC: Recent Labs  Lab 08/07/20 1857 08/07/20 1858  WBC 10.4  --   NEUTROABS 6.6  --   HGB 15.4 16.0  HCT 47.0 47.0  MCV 86.9  --   PLT 202  --     Basic Metabolic Panel: Recent Labs  Lab 08/07/20 1857 08/07/20 1858  NA 135 137  K 3.8 3.7  CL 101 99  CO2 25  --   GLUCOSE 72 68*  BUN 13 15  CREATININE 1.06 1.00  CALCIUM 8.9  --      Liver Function Tests: Recent Labs  Lab 08/07/20 1857  AST 18  ALT 18  ALKPHOS 84  BILITOT 1.1  PROT 6.8  ALBUMIN 3.3*     Antibiotics: Anti-infectives (From admission, onward)  None       DVT prophylaxis: Lovenox  Code Status: Full code  Family Communication: No family at bedside   Consultants:  Neurology  Procedures:      Objective   Vitals:   08/08/20 2314 08/09/20 0341 08/09/20 0836 08/09/20 1200  BP: (!) 150/88 (!) 153/93 137/78 (!) 158/83  Pulse: 75 65 62 60  Resp: 16 18 18 16   Temp: 98.5 F (36.9 C) 97.7 F (36.5 C) 97.7 F (36.5 C) 98.5 F (36.9 C)  TempSrc: Oral Oral Oral Oral  SpO2: 98% 97% 96% 100%  Weight:      Height:        Intake/Output Summary (Last 24 hours) at 08/09/2020 1558 Last data filed at  08/09/2020 1200 Gross per 24 hour  Intake 600 ml  Output 1400 ml  Net -800 ml    11/10 1901 - 11/12 0700 In: 1524 [P.O.:1524] Out: 975 [Urine:975]  Filed Weights   08/07/20 1932  Weight: 106.6 kg    Physical Examination:    General-appears in no acute distress  Heart-S1-S2, regular, no murmur auscultated  Lungs-clear to auscultation bilaterally, no wheezing or crackles auscultated  Abdomen-soft, nontender, no organomegaly  Extremities-no edema in the lower extremities  Neuro-alert, oriented x3, no focal deficit noted   Status is: Inpatient  Dispo: The patient is from: Home              Anticipated d/c is to: Skilled nursing facility              Anticipated d/c date is: 08/10/2020              Patient currently not medically stable for discharge  Barrier to discharge-ongoing  evaluation for stroke, right foot ulcer, unsafe discharge due to lack of insight and schizoaffective disorder. Psych consulted.      Data Reviewed:   Recent Results (from the past 240 hour(s))  Resp Panel by RT PCR (RSV, Flu A&B, Covid) - Nasopharyngeal Swab     Status: None   Collection Time: 08/07/20  7:43 PM   Specimen: Nasopharyngeal Swab  Result Value Ref Range Status   SARS Coronavirus 2 by RT PCR NEGATIVE NEGATIVE Final    Comment: (NOTE) SARS-CoV-2 target nucleic acids are NOT DETECTED.  The SARS-CoV-2 RNA is generally detectable in upper respiratoy specimens during the acute phase of infection. The lowest concentration of SARS-CoV-2 viral copies this assay can detect is 131 copies/mL. A negative result does not preclude SARS-Cov-2 infection and should not be used as the sole basis for treatment or other patient management decisions. A negative result may occur with  improper specimen collection/handling, submission of specimen other than nasopharyngeal swab, presence of viral mutation(s) within the areas targeted by this assay, and inadequate number of viral copies (<131  copies/mL). A negative result must be combined with clinical observations, patient history, and epidemiological information. The expected result is Negative.  Fact Sheet for Patients:  https://www.moore.com/https://www.fda.gov/media/142436/download  Fact Sheet for Healthcare Providers:  https://www.young.biz/https://www.fda.gov/media/142435/download  This test is no t yet approved or cleared by the [place] FDA and  has been authorized for detection and/or diagnosis of SARS-CoV-2 by FDA under an Emergency Use Authorization (EUA). This EUA will remain  in effect (meaning this test can be used) for the duration of the COVID-19 declaration under Section 564(b)(1) of the Act, 21 U.S.C. section 360bbb-3(b)(1), unless the authorization is terminated or revoked sooner.     Influenza A by PCR NEGATIVE NEGATIVE Final  Influenza B by PCR NEGATIVE NEGATIVE Final    Comment: (NOTE) The Xpert Xpress SARS-CoV-2/FLU/RSV assay is intended as an aid in  the diagnosis of influenza from Nasopharyngeal swab specimens and  should not be used as a sole basis for treatment. Nasal washings and  aspirates are unacceptable for Xpert Xpress SARS-CoV-2/FLU/RSV  testing.  Fact Sheet for Patients: https://www.moore.com/  Fact Sheet for Healthcare Providers: https://www.young.biz/  This test is not yet approved or cleared by the [place] FDA and  has been authorized for detection and/or diagnosis of SARS-CoV-2 by  FDA under an Emergency Use Authorization (EUA). This EUA will remain  in effect (meaning this test can be used) for the duration of the  Covid-19 declaration under Section 564(b)(1) of the Act, 21  U.S.C. section 360bbb-3(b)(1), unless the authorization is  terminated or revoked.    Respiratory Syncytial Virus by PCR NEGATIVE NEGATIVE Final    Comment: (NOTE) Fact Sheet for Patients: https://www.moore.com/  Fact Sheet for Healthcare  Providers: https://www.young.biz/  This test is not yet approved or cleared by the [place] FDA and  has been authorized for detection and/or diagnosis of SARS-CoV-2 by  FDA under an Emergency Use Authorization (EUA). This EUA will remain  in effect (meaning this test can be used) for the duration of the  COVID-19 declaration under Section 564(b)(1) of the Act, 21 U.S.C.  section 360bbb-3(b)(1), unless the authorization is terminated or  revoked. Performed at [facility], [address], [place], [place] [address]     No results for input(s): LIPASE, AMYLASE in the last 168 hours. No results for input(s): AMMONIA in the last 168 hours.  Cardiac Enzymes: No results for input(s): CKTOTAL, CKMB, CKMBINDEX, TROPONINI in the last 168 hours. BNP (last 3 results) No results for input(s): BNP in the last 8760 hours.  ProBNP (last 3 results) No results for input(s): PROBNP in the last 8760 hours.  Studies:  EEG  Result Date: 08/08/2020 Charlsie Quest, MD     08/08/2020 11:11 AM Patient Name: Jeremy Lang MRN: [id number] Epilepsy Attending: Charlsie Quest Referring Physician/Provider: Dr Erick Blinks Date: 08/07/2020 Duration: 23. 48 minutes Patient history: 54 year old male with history of epilepsy who presented with acute onset right-sided weakness with intermittent aphasia.  EEG to evaluate for seizures. Level of alertness: Awake AEDs during EEG study: Keppra, Ativan Technical aspects: This EEG study was done with scalp electrodes positioned according to the 10-20 International system of electrode placement. Electrical activity was acquired at a sampling rate of 500Hz  and reviewed with a high frequency filter of 70Hz  and a low frequency filter of 1Hz . EEG data were recorded continuously and digitally stored. Description: No clear posterior dominant rhythm was seen.  There is an excessive amount of 15 to 18 Hz beta activity distributed symmetrically  and diffusely.  Hyperventilation and photic stimulation were not performed.   ABNORMALITY -Excessive beta, generalized IMPRESSION: This study is within normal limits. The excessive beta activity seen in the background is most likely due to the effect of benzodiazepine and is a benign EEG pattern. No seizures or epileptiform discharges were seen throughout the recording.   DG Elbow 2 Views Right  Result Date: 08/08/2020 CLINICAL DATA:  Pain status post fall EXAM: RIGHT ELBOW - 2 VIEW COMPARISON:  None. FINDINGS: There is no evidence of fracture, dislocation, or joint effusion. There is no evidence of arthropathy or other focal bone abnormality. Soft tissues are unremarkable. IMPRESSION: Negative. Electronically Signed  By: Katherine Mantle M.D.   On: 08/08/2020 21:53   CT HEAD WO CONTRAST  Result Date: 08/08/2020 CLINICAL DATA:  Larey Seat off toilet, hit head EXAM: CT HEAD WITHOUT CONTRAST TECHNIQUE: Contiguous axial images were obtained from the base of the skull through the vertex without intravenous contrast. COMPARISON:  08/07/2020, 08/08/2020 FINDINGS: Brain: Stable chronic ischemic changes within the periventricular white matter. No acute infarct or hemorrhage. Lateral ventricles and midline structures are unremarkable. No acute extra-axial fluid collections. No mass effect. Vascular: No hyperdense vessel or unexpected calcification. Skull: Normal. Negative for fracture or focal lesion. Sinuses/Orbits: Stable mucosal thickening within the maxillary sinuses. Other: None. IMPRESSION: 1. Stable chronic small vessel ischemic changes. No acute intracranial process. Electronically Signed   By: Sharlet Salina M.D.   On: 08/08/2020 22:21   MR BRAIN WO CONTRAST  Result Date: 08/08/2020 CLINICAL DATA:  Acute right-sided weakness EXAM: MRI HEAD WITHOUT CONTRAST TECHNIQUE: Multiplanar, multiecho pulse sequences of the brain and surrounding structures were obtained without intravenous contrast.  COMPARISON:  None. FINDINGS: Brain: No acute infarct, acute hemorrhage or extra-axial collection. Old left corona radiata small vessel infarct. There is multifocal periventricular white matter hyperintensity, most often a result of chronic microvascular ischemia. There is generalized atrophy without lobar predilection. No chronic microhemorrhage. Normal midline structures. Vascular: Normal flow voids. Skull and upper cervical spine: Normal marrow signal. Sinuses/Orbits: Bilateral maxillary sinus mucosal thickening. Normal orbits. Other: None. IMPRESSION: 1. No acute intracranial abnormality. 2. Old left corona radiata small vessel infarct. Electronically Signed   By: Deatra Robinson M.D.   On: 08/08/2020 00:55   DG HIP UNILAT WITH PELVIS 2-3 VIEWS RIGHT  Result Date: 08/08/2020 CLINICAL DATA:  Hip pain EXAM: DG HIP (WITH OR WITHOUT PELVIS) 2-3V RIGHT COMPARISON:  None. FINDINGS: There is mild bilateral hip osteoarthritis. There is no acute displaced fracture or dislocation. There are multiple calcifications projecting over the patient's pelvis that are favored to represent phleboliths. IMPRESSION: Mild bilateral hip osteoarthritis. No acute displaced fracture or dislocation. Electronically Signed   By: Katherine Mantle M.D.   On: 08/08/2020 21:54   CT HEAD CODE STROKE WO CONTRAST  Result Date: 08/07/2020 CLINICAL DATA:  Code stroke. 54 year old male with right side weakness. EXAM: CT HEAD WITHOUT CONTRAST TECHNIQUE: Contiguous axial images were obtained from the base of the skull through the vertex without intravenous contrast. COMPARISON:  Head CT 04/23/2020.  Brain MRI 04/24/2020. FINDINGS: Brain: Chronic lacunar infarct of the left corona radiata and lentiform appears stable since July. Stable gray-white matter differentiation elsewhere. Stable chronic encephalomalacia in the left amygdala. No midline shift, ventriculomegaly, mass effect, evidence of mass lesion, intracranial hemorrhage or evidence of  cortically based acute infarction. Vascular: Calcified atherosclerosis at the skull base. No suspicious intracranial vascular hyperdensity. Skull: No acute osseous abnormality identified. Sinuses/Orbits: Increased fluid in the left maxillary sinus. Increased right maxillary mucosal thickening. Other visualized paranasal sinuses and mastoids are stable and well pneumatized. Other: No acute orbit or scalp soft tissue finding. ASPECTS [facility] Stroke Program Early CT Score) Total score (0-10 with 10 being normal): 10 (chronic encephalomalacia in the left hemisphere). IMPRESSION: 1. Stable non contrast CT appearance of the brain since July, with chronic small vessel disease in the left corona radiata and chronic left amygdala encephalomalacia. 2. No acute cortically based infarct or acute intracranial hemorrhage identified. ASPECTS 10. 3. These results were communicated to Dr. Derry Lory at 7:08 pmon 11/10/2021by text page via the [facility] messaging system. Electronically Signed   By: Rexene Edison  Margo Aye M.D.   On: 08/07/2020 19:09   CT ANGIO HEAD CODE STROKE  Result Date: 08/07/2020 CLINICAL DATA:  54 year old male code stroke presentation. Right side weakness. EXAM: CT ANGIOGRAPHY HEAD AND NECK TECHNIQUE: Multidetector CT imaging of the head and neck was performed using the standard protocol during bolus administration of intravenous contrast. Multiplanar CT image reconstructions and MIPs were obtained to evaluate the vascular anatomy. Carotid stenosis measurements (when applicable) are obtained utilizing NASCET criteria, using the distal internal carotid diameter as the denominator. CONTRAST:  75mL OMNIPAQUE IOHEXOL 350 MG/ML SOLN COMPARISON:  Plain head CT 1858 hours today. Brain MRI and intracranial MRA 04/24/2020. FINDINGS: CTA NECK Skeleton: Prior sternotomy. Absent dentition. No acute osseous abnormality identified. Bilateral maxillary sinus disease again noted. Upper chest: Calcified coronary artery atherosclerosis.  Visible central pulmonary arteries appear patent. Negative upper lungs. Other neck: Reflux of venous contrast throughout the right neck including in the right EJ and retromandibular vein to the level of the temporalis muscle. No other acute finding identified. Aortic arch: 4 vessel arch configuration, the left vertebral arises directly from the arch. Mild arch atherosclerosis. Prior CABG. Right carotid system: Negative brachiocephalic artery and right CCA. Mild soft and calcified plaque at the right carotid bifurcation without stenosis. Mildly tortuous cervical right ICA. Left carotid system: Mild plaque in the left CCA proximal to the bifurcation without stenosis. Moderate soft and calcified plaque at the left ICA origin, but no stenosis. Vertebral arteries: Mild plaque in the proximal right subclavian artery without stenosis. Right vertebral artery origin is patent. The right V1 segment is obscured by surrounding venous contrast and streak artifact, but the visible right vertebral appears patent and normal to the skull base. The right vertebral artery is dominant. Non dominant left vertebral artery arises directly from the arch. The left vertebral remains diminutive but patent to the skull base. CTA HEAD Posterior circulation: Right vertebral supplies the basilar as on the July MRA. Normal right PICA origin. Left vertebral functionally terminates in PICA. No right vertebral or basilar artery stenosis. Patent SCA origins. Fetal type right PCA origin again noted. Normal left PCA origin with diminutive left posterior communicating artery and stable moderate left P1/P2 stenosis (series 10, image 121). Mild to moderate right P2 and P3 segment irregularity and stenosis appears stable on series 11, image 20. Anterior circulation: Both ICA siphons are patent. Moderate calcified plaque on the left but no left siphon stenosis. Similar moderate plaque on the right with mild distal petrous stenosis only. Normal right  posterior communicating artery origin. Normal ophthalmic artery origins. Patent carotid termini. Dominant left and diminutive or absent right ACA A1 segment with ectatic anterior communicating artery as before. The anterior communicating also appears mildly fenestrated. There is no discrete saccular aneurysm. ACAs appears stable since July. Left MCA M1 remains patent with stable mild to moderate irregularity and stenosis on series 10, image 17. Left MCA trifurcation and left MCA branches are patent and appear stable. Right MCA M1 segment is stable with mild irregularity. Patent right MCA trifurcation. Right MCA branches are stable and within normal limits. Venous sinuses: Early contrast timing, not well evaluated. Anatomic variants: Dominant right vertebral artery supplies the basilar. Non dominant left vertebral arises from the arch and functionally terminates in PICA. Dominant left and diminutive or absent right ACA A1 segments. Review of the MIP images confirms the above findings IMPRESSION: 1. Negative for large vessel occlusion. 2. Stable since the July MRA, with intracranial atherosclerosis and moderate stenoses of the Left  MCA M1, Left PCA P1/P2. 3. Cervical Carotid atherosclerosis without significant stenosis. 4. Prior CABG. These results were communicated to Dr. Erick Blinks at 7:38 pmon 11/10/2021by text page via the [facility] messaging system. Electronically Signed   By: Odessa Fleming M.D.   On: 08/07/2020 19:38   CT ANGIO NECK CODE STROKE  Result Date: 08/07/2020 CLINICAL DATA:  54 year old male code stroke presentation. Right side weakness. EXAM: CT ANGIOGRAPHY HEAD AND NECK TECHNIQUE: Multidetector CT imaging of the head and neck was performed using the standard protocol during bolus administration of intravenous contrast. Multiplanar CT image reconstructions and MIPs were obtained to evaluate the vascular anatomy. Carotid stenosis measurements (when applicable) are obtained utilizing NASCET criteria,  using the distal internal carotid diameter as the denominator. CONTRAST:  58mL OMNIPAQUE IOHEXOL 350 MG/ML SOLN COMPARISON:  Plain head CT 1858 hours today. Brain MRI and intracranial MRA 04/24/2020. FINDINGS: CTA NECK Skeleton: Prior sternotomy. Absent dentition. No acute osseous abnormality identified. Bilateral maxillary sinus disease again noted. Upper chest: Calcified coronary artery atherosclerosis. Visible central pulmonary arteries appear patent. Negative upper lungs. Other neck: Reflux of venous contrast throughout the right neck including in the right EJ and retromandibular vein to the level of the temporalis muscle. No other acute finding identified. Aortic arch: 4 vessel arch configuration, the left vertebral arises directly from the arch. Mild arch atherosclerosis. Prior CABG. Right carotid system: Negative brachiocephalic artery and right CCA. Mild soft and calcified plaque at the right carotid bifurcation without stenosis. Mildly tortuous cervical right ICA. Left carotid system: Mild plaque in the left CCA proximal to the bifurcation without stenosis. Moderate soft and calcified plaque at the left ICA origin, but no stenosis. Vertebral arteries: Mild plaque in the proximal right subclavian artery without stenosis. Right vertebral artery origin is patent. The right V1 segment is obscured by surrounding venous contrast and streak artifact, but the visible right vertebral appears patent and normal to the skull base. The right vertebral artery is dominant. Non dominant left vertebral artery arises directly from the arch. The left vertebral remains diminutive but patent to the skull base. CTA HEAD Posterior circulation: Right vertebral supplies the basilar as on the July MRA. Normal right PICA origin. Left vertebral functionally terminates in PICA. No right vertebral or basilar artery stenosis. Patent SCA origins. Fetal type right PCA origin again noted. Normal left PCA origin with diminutive left  posterior communicating artery and stable moderate left P1/P2 stenosis (series 10, image 121). Mild to moderate right P2 and P3 segment irregularity and stenosis appears stable on series 11, image 20. Anterior circulation: Both ICA siphons are patent. Moderate calcified plaque on the left but no left siphon stenosis. Similar moderate plaque on the right with mild distal petrous stenosis only. Normal right posterior communicating artery origin. Normal ophthalmic artery origins. Patent carotid termini. Dominant left and diminutive or absent right ACA A1 segment with ectatic anterior communicating artery as before. The anterior communicating also appears mildly fenestrated. There is no discrete saccular aneurysm. ACAs appears stable since July. Left MCA M1 remains patent with stable mild to moderate irregularity and stenosis on series 10, image 17. Left MCA trifurcation and left MCA branches are patent and appear stable. Right MCA M1 segment is stable with mild irregularity. Patent right MCA trifurcation. Right MCA branches are stable and within normal limits. Venous sinuses: Early contrast timing, not well evaluated. Anatomic variants: Dominant right vertebral artery supplies the basilar. Non dominant left vertebral arises from the arch and functionally terminates in PICA.  Dominant left and diminutive or absent right ACA A1 segments. Review of the MIP images confirms the above findings IMPRESSION: 1. Negative for large vessel occlusion. 2. Stable since the July MRA, with intracranial atherosclerosis and moderate stenoses of the Left MCA M1, Left PCA P1/P2. 3. Cervical Carotid atherosclerosis without significant stenosis. 4. Prior CABG. These results were communicated to Dr. Erick Blinks at 7:38 pmon 11/10/2021by text page via the [facility] messaging system. Electronically Signed   By: Odessa Fleming M.D.   On: 08/07/2020 19:38       Hersh Minney S Zymeir Salminen   [facility] If 7PM-7AM, please contact night-coverage at  [email or web address], Office  [phone]   08/09/2020, 3:58 PM  LOS: 1 day

## 2020-08-10 ENCOUNTER — Inpatient Hospital Stay (HOSPITAL_COMMUNITY): Payer: Medicaid Other

## 2020-08-10 ENCOUNTER — Other Ambulatory Visit (HOSPITAL_COMMUNITY): Payer: Medicaid Other

## 2020-08-10 LAB — GLUCOSE, CAPILLARY
Glucose-Capillary: 208 mg/dL — ABNORMAL HIGH (ref 70–99)
Glucose-Capillary: 231 mg/dL — ABNORMAL HIGH (ref 70–99)
Glucose-Capillary: 284 mg/dL — ABNORMAL HIGH (ref 70–99)
Glucose-Capillary: 152 mg/dL — ABNORMAL HIGH (ref 70–99)

## 2020-08-10 MED ORDER — IBUPROFEN 200 MG PO TABS
400.0000 mg | ORAL_TABLET | Freq: Once | ORAL | Status: AC | PRN
  Administered 2020-08-10: 400 mg via ORAL
  Filled 2020-08-10: qty 2

## 2020-08-10 MED ORDER — LIDOCAINE 5 % EX PTCH
1.0000 | MEDICATED_PATCH | Freq: Every day | CUTANEOUS | Status: DC
  Administered 2020-08-10 – 2020-08-15 (×6): 1 via TRANSDERMAL
  Filled 2020-08-10 (×6): qty 1

## 2020-08-10 NOTE — Progress Notes (Signed)
[facility]  PROGRESS NOTE  Jeremy ScarceMatthew Lang ZOX:[id number] DOB: 04/01/1966 DOA: 08/07/2020 PCP: Patient, No Pcp Per   Brief HPI:   54 year old male with history of diabetes mellitus type 2, CAD s/p CABG, seizures, prior hemorrhagic liver infarct, schizoaffective disorder came to ED for evaluation of acute right-sided weakness.  Patient states that he had new onset right upper and lower extremity weakness with difficulty speaking.  CT head showed no acute infarct.  CTA head and neck was negative for large vessel occlusion.  Neurology was consulted, MRI brain and EEG were recommended.    Subjective   Patient seen and examined, denies any complaints.  Blood glucose is elevated, but patient is eating regular diet, did not want to eat carb modified diet.   Assessment/Plan:     1. Right-sided weakness-patient's exam is not consistent, also documented by neurology that patient likely has psychogenic component.  MRI brain is negative for stroke.  EEG is negative.  Neurology has signed off. 2. Right diabetic foot ulcer-wound care was consulted, and recommended podiatry consultation.  Will consult podiatry for diabetic foot ulcer and also will need a better footwear to avoid possibility of infection and injury to limb loss. 3. Schizoaffective disorder-patient has schizoaffective disorder, currently on no medications.  He presented with psychogenic seizure/right-sided weakness.  Psychiatry was consulted, they did not feel that patient needs any acute therapy in the hospital.  Patient can follow-up outpatient psychiatry for in-depth evaluation.  Patient is cleared for discharge as per psychiatry.   4. CAD s/p CABG-patient reports prior CABG in 2009, currently on no medications. 5. Diabetes mellitus type 2-CBGs elevated, continue sensitive sliding scale insulin with NovoLog.  Hemoglobin A1c is 8.2.  Continue  Metformin 500 twice daily.  Dose of Lantus was increased  to 15 units subcu daily.   Continue moderate sliding scale insulin. 6. Seizure-like activity-patient had seizure-like activity in the ED while in the CT scanner.  He received IV Keppra and Ativan.  Neurology saw the patient and it seems that he had a psychogenic seizure.  No further antiepileptic medications recommended.  EEG is negative for epileptiform discharges.     COVID-19 Labs  No results for input(s): DDIMER, FERRITIN, LDH, CRP in the last 72 hours.  Lab Results  Component Value Date   SARSCOV2NAA NEGATIVE 08/07/2020     Scheduled medications:   . insulin aspart  0-15 Units Subcutaneous TID WC  . insulin aspart  0-5 Units Subcutaneous QHS  . insulin glargine  15 Units Subcutaneous QHS  . metFORMIN  500 mg Oral BID WC  . mupirocin ointment   Topical BID  . polyethylene glycol  17 g Oral Daily  . senna-docusate  1 tablet Oral QHS         CBG: Recent Labs  Lab 08/09/20 1143 08/09/20 1643 08/09/20 2107 08/10/20 0617 08/10/20 1157  GLUCAP 238* 229* 200* 208* 231*    SpO2: 100 %    CBC: Recent Labs  Lab 08/07/20 1857 08/07/20 1858  WBC 10.4  --   NEUTROABS 6.6  --   HGB 15.4 16.0  HCT 47.0 47.0  MCV 86.9  --   PLT 202  --     Basic Metabolic Panel: Recent Labs  Lab 08/07/20 1857 08/07/20 1858  NA 135 137  K 3.8 3.7  CL 101 99  CO2 25  --   GLUCOSE 72 68*  BUN 13 15  CREATININE 1.06 1.00  CALCIUM 8.9  --  Liver Function Tests: Recent Labs  Lab 08/07/20 1857  AST 18  ALT 18  ALKPHOS 84  BILITOT 1.1  PROT 6.8  ALBUMIN 3.3*     Antibiotics: Anti-infectives (From admission, onward)   None       DVT prophylaxis: Lovenox  Code Status: Full code  Family Communication: No family at bedside   Consultants:  Neurology  Procedures:      Objective   Vitals:   08/10/20 0332 08/10/20 0802 08/10/20 1200 08/10/20 1600  BP: (!) 151/86 (!) 178/92 138/82 (!) 150/94  Pulse: 77 77 84 87  Resp: 20 16 16 16   Temp: 98.4 F (36.9 C) 97.6 F (36.4  C) (!) 97.2 F (36.2 C) (!) 97.4 F (36.3 C)  TempSrc: Oral Oral Oral Oral  SpO2: 95% 99% 99% 100%  Weight:      Height:        Intake/Output Summary (Last 24 hours) at 08/10/2020 1619 Last data filed at 08/10/2020 1159 Gross per 24 hour  Intake 236 ml  Output 1075 ml  Net -839 ml    11/11 1901 - 11/13 0700 In: 600 [P.O.:600] Out: 2000 [Urine:2000]  Filed Weights   08/07/20 1932  Weight: 106.6 kg    Physical Examination:   General-appears in no acute distress Heart-S1-S2, regular, no murmur auscultated Lungs-clear to auscultation bilaterally, no wheezing or crackles auscultated Abdomen-soft, nontender, no organomegaly Extremities-no edema in the lower extremities Neuro-alert, oriented x3, no focal deficit noted  Status is: Inpatient  Dispo: The patient is from: Home              Anticipated d/c is to: Skilled nursing facility              Anticipated d/c date is:  08/12/2020              Patient currently not medically stable for discharge  Barrier to discharge-ongoing  evaluation for stroke, right foot ulcer, unsafe discharge due to lack of insight and schizoaffective disorder. Psych consulted.      Data Reviewed:   Recent Results (from the past 240 hour(s))  Resp Panel by RT PCR (RSV, Flu A&B, Covid) - Nasopharyngeal Swab     Status: None   Collection Time: 08/07/20  7:43 PM   Specimen: Nasopharyngeal Swab  Result Value Ref Range Status   SARS Coronavirus 2 by RT PCR NEGATIVE NEGATIVE Final    Comment: (NOTE) SARS-CoV-2 target nucleic acids are NOT DETECTED.  The SARS-CoV-2 RNA is generally detectable in upper respiratoy specimens during the acute phase of infection. The lowest concentration of SARS-CoV-2 viral copies this assay can detect is 131 copies/mL. A negative result does not preclude SARS-Cov-2 infection and should not be used as the sole basis for treatment or other patient management decisions. A negative result may occur with  improper  specimen collection/handling, submission of specimen other than nasopharyngeal swab, presence of viral mutation(s) within the areas targeted by this assay, and inadequate number of viral copies (<131 copies/mL). A negative result must be combined with clinical observations, patient history, and epidemiological information. The expected result is Negative.  Fact Sheet for Patients:  13/10/21  Fact Sheet for Healthcare Providers:  https://www.moore.com/  This test is no t yet approved or cleared by the https://www.young.biz/ FDA and  has been authorized for detection and/or diagnosis of SARS-CoV-2 by FDA under an Emergency Use Authorization (EUA). This EUA will remain  in effect (meaning this test can be used) for the duration of  the COVID-19 declaration under Section 564(b)(1) of the Act, 21 U.S.C. section 360bbb-3(b)(1), unless the authorization is terminated or revoked sooner.     Influenza A by PCR NEGATIVE NEGATIVE Final   Influenza B by PCR NEGATIVE NEGATIVE Final    Comment: (NOTE) The Xpert Xpress SARS-CoV-2/FLU/RSV assay is intended as an aid in  the diagnosis of influenza from Nasopharyngeal swab specimens and  should not be used as a sole basis for treatment. Nasal washings and  aspirates are unacceptable for Xpert Xpress SARS-CoV-2/FLU/RSV  testing.  Fact Sheet for Patients: https://www.moore.com/  Fact Sheet for Healthcare Providers: https://www.young.biz/  This test is not yet approved or cleared by the [place] FDA and  has been authorized for detection and/or diagnosis of SARS-CoV-2 by  FDA under an Emergency Use Authorization (EUA). This EUA will remain  in effect (meaning this test can be used) for the duration of the  Covid-19 declaration under Section 564(b)(1) of the Act, 21  U.S.C. section 360bbb-3(b)(1), unless the authorization is  terminated or revoked.     Respiratory Syncytial Virus by PCR NEGATIVE NEGATIVE Final    Comment: (NOTE) Fact Sheet for Patients: https://www.moore.com/  Fact Sheet for Healthcare Providers: https://www.young.biz/  This test is not yet approved or cleared by the [place] FDA and  has been authorized for detection and/or diagnosis of SARS-CoV-2 by  FDA under an Emergency Use Authorization (EUA). This EUA will remain  in effect (meaning this test can be used) for the duration of the  COVID-19 declaration under Section 564(b)(1) of the Act, 21 U.S.C.  section 360bbb-3(b)(1), unless the authorization is terminated or  revoked. Performed at [facility], [address] [place], [place] [address]     No results for input(s): LIPASE, AMYLASE in the last 168 hours. No results for input(s): AMMONIA in the last 168 hours.  Cardiac Enzymes: No results for input(s): CKTOTAL, CKMB, CKMBINDEX, TROPONINI in the last 168 hours. BNP (last 3 results) No results for input(s): BNP in the last 8760 hours.  ProBNP (last 3 results) No results for input(s): PROBNP in the last 8760 hours.  Studies:  DG Elbow 2 Views Right  Result Date: 08/08/2020 CLINICAL DATA:  Pain status post fall EXAM: RIGHT ELBOW - 2 VIEW COMPARISON:  None. FINDINGS: There is no evidence of fracture, dislocation, or joint effusion. There is no evidence of arthropathy or other focal bone abnormality. Soft tissues are unremarkable. IMPRESSION: Negative. Electronically Signed   By: Katherine Mantle M.D.   On: 08/08/2020 21:53   CT HEAD WO CONTRAST  Result Date: 08/08/2020 CLINICAL DATA:  Larey Seat off toilet, hit head EXAM: CT HEAD WITHOUT CONTRAST TECHNIQUE: Contiguous axial images were obtained from the base of the skull through the vertex without intravenous contrast. COMPARISON:  08/07/2020, 08/08/2020 FINDINGS: Brain: Stable chronic ischemic changes within the periventricular white matter. No acute infarct  or hemorrhage. Lateral ventricles and midline structures are unremarkable. No acute extra-axial fluid collections. No mass effect. Vascular: No hyperdense vessel or unexpected calcification. Skull: Normal. Negative for fracture or focal lesion. Sinuses/Orbits: Stable mucosal thickening within the maxillary sinuses. Other: None. IMPRESSION: 1. Stable chronic small vessel ischemic changes. No acute intracranial process. Electronically Signed   By: Sharlet Salina M.D.   On: 08/08/2020 22:21   EEG adult  Result Date: 08/10/2020 Rejeana Brock, MD     08/10/2020  1:11 PM Patient Name: Jeremy Lang MRN: [id number] EEG Attending: Ritta Slot Referring Physician/Provider: Uvaldo Bristle Date: 08/10/2020 Duration: 27  minutes Patient history: 54 year old male being evaluated for right-sided weakness Level of alertness: Awake AEDs during EEG study: None Technical aspects: This EEG study was done with scalp electrodes positioned according to the 10-20 International system of electrode placement. Electrical activity was acquired at a sampling rate of 500Hz  and reviewed with a high frequency filter of 70Hz  and a low frequency filter of 1Hz . EEG data were recorded continuously and digitally stored. BACKGROUND ACTIVITY: Posterior dominant rhythm: The posterior dominant rhythm consists of 11-12 Hz activity of 10 - 15uV seen predominantly in posterior head regions, symmetric and reactive to eye opening and eye closing.        Slowing: None EPILEPTIFORM ACTIVITY: Interictal epileptiform activity: None Ictal Activity: None OTHER EVENTS: None SLEEP RECORDINGS: Only awake and drowsy states were recorded. Drowsiness was characterized by anterior shifting of the posterior background rhythm. ACTIVATION PROCEDURES: Hyperventilation - mild slowing photic stimulation -physiological driving is present IMPRESSION: This study is within normal limits. There was no seizure or evidence of seizure predisposition recorded on this  study. Please note that lack of epileptiform activity on EEG does not preclude the possibility of epilepsy.  , MD [facility] [phone] If 7pm- 7am, please page neurology on call as listed in AMION.   DG HIP UNILAT WITH PELVIS 2-3 VIEWS RIGHT  Result Date: 08/08/2020 CLINICAL DATA:  Hip pain EXAM: DG HIP (WITH OR WITHOUT PELVIS) 2-3V RIGHT COMPARISON:  None. FINDINGS: There is mild bilateral hip osteoarthritis. There is no acute displaced fracture or dislocation. There are multiple calcifications projecting over the patient's pelvis that are favored to represent phleboliths. IMPRESSION: Mild bilateral hip osteoarthritis. No acute displaced fracture or dislocation. Electronically Signed   By: Ritta Slot M.D.   On: 08/08/2020 21:54       Chibueze Beasley S Feliz Herard   [facility] If 7PM-7AM, please contact night-coverage at [email or web address], Office  [phone]   08/10/2020, 4:19 PM  LOS: 2 days

## 2020-08-10 NOTE — Progress Notes (Signed)
PT Cancellation Note  Patient Details Name: Jeremy Lang MRN: [id number] DOB: 09-25-1966   Cancelled Treatment:    Reason Eval/Treat Not Completed: (P) Other (comment) (Pt's dinner tray has just arrived will defer PT at this time.)   Florestine Avers 08/10/2020, 5:23 PM  Bonney Leitz , PTA Acute Rehabilitation Services Pager [phone] Office [phone]

## 2020-08-10 NOTE — Progress Notes (Addendum)
[facility] PROGRESS NOTE  Subjective: [name] was laying in bed on my visit with all but one light turned off, TV off but looking at his phone. He endorses a midline headache and eye pain that worsens with upward gaze and bright lights. He does not have headaches regularly. [name] received the influenza and pneumonia vaccines yesterday. [name] denies dizziness and continues to endorse blurred vision, but describes it as an inability to focus his eyes.  Objective:  Pertinent Imaging: MR Brain 10Nov2021: No acute intracranial abnormality. Old left corona radiata small vessel infarct. CT Head 11Nov2021: Stable chronic small vessel ischemic changes. No acute intracranial process. EEG 13Nov2021: Within normal limits   Current vital signs: BP (!) 150/94 (BP Location: Left Arm)   Pulse 87   Temp (!) 97.4 F (36.3 C) (Oral)   Resp 16   Ht 5\' 7"  (1.702 m)   Wt 106.6 kg   SpO2 100%   BMI 36.81 kg/m  Vital signs in last 24 hours: Temp:  [97.2 F (36.2 C)-98.7 F (37.1 C)] 97.4 F (36.3 C) (11/13 1600) Pulse Rate:  [75-87] 87 (11/13 1600) Resp:  [16-20] 16 (11/13 1600) BP: (133-178)/(82-94) 150/94 (11/13 1600) SpO2:  [95 %-100 %] 100 % (11/13 1600)  Intake/Output from previous day: 11/12 0701 - 11/13 0700 In: 120 [P.O.:120] Out: 1200 [Urine:1200] Intake/Output this shift: Total I/O In: 236 [P.O.:236] Out: 875 [Urine:875] Nutritional status:  Diet Order            Diet regular Room service appropriate? Yes; Fluid consistency: Thin  Diet effective now                 ROS:                                                                                                                                       History obtained from the patient, chart  General: negative for chills, fever Psychological: History of schizoaffective disorder HEENT: As noted in subjective Respiratory: negative for cough, shortness of breath Cardiovascular: negative for  chest pain  Gastrointestinal ROS: negative for abdominal pain, diarrhea, nausea/vomiting Musculoskeletal ROS: Chronic right-sided weakness from previous stroke Neurological ROS: as noted in HPI  General exam   HEENT:  Normocephalic, no lesions, without obvious abnormality.  Normal external eye and conjunctiva.   Cardiovascular: RRR Pulmonary: Breathing comfortably on room air Abdomen: Soft, nontender Extremities: Warm, dry and intact Musculoskeletal: No joint tenderness, deformity or swelling Skin: Warm and dry  Neurologic Exam: General: No acute distress Mental Status: Alert, oriented, thought content appropriate.  Speech fluent without evidence of aphasia.  Able to follow 3 step commands without difficulty. Cranial Nerves: II:  Visual fields grossly normal, pupils equal, round, reactive to light III,IV, VI: ptosis not present, extra-ocular motions intact on the lateral spectrum, was unable to assess vertical movements - limited by pain V,VII: face symmetric, facial light  touch sensation normal bilaterally VIII: hearing normal bilaterally IX,X: uvula rises symmetrically XI: bilateral shoulder shrug Right 4/5 Left: 5/5 XII: midline tongue extension without atrophy or fasciculations Motor: Right : Upper extremity   4/5    Left:     Upper extremity   5/5     - Upper limb strength above are for flexors; extensors were 5/5 bilaterally  Lower extremity   2/5     Lower extremity   3/5     - Lower limb exam limited by a suspected lack of effort, as there was no pressure put on the opposite leg during exam. Tone and bulk:normal tone throughout; no atrophy noted Sensory: Light touch intact throughout, Left > Right (chronic) Deep Tendon Reflexes: 2+ and symmetric throughout  Plantars: Right: downgoing   Left: downgoing Cerebellar: normal finger-to-nose. Gait: Did not assess  Lab Results: Basic Metabolic Panel: Recent Labs  Lab 08/07/20 1857 08/07/20 1858  NA 135 137  K 3.8 3.7  CL  101 99  CO2 25  --   GLUCOSE 72 68*  BUN 13 15  CREATININE 1.06 1.00  CALCIUM 8.9  --     Liver Function Tests: Recent Labs  Lab 08/07/20 1857  AST 18  ALT 18  ALKPHOS 84  BILITOT 1.1  PROT 6.8  ALBUMIN 3.3*   No results for input(s): AMMONIA in the last 168 hours.  CBC: Recent Labs  Lab 08/07/20 1857 08/07/20 1858  WBC 10.4  --   NEUTROABS 6.6  --   HGB 15.4 16.0  HCT 47.0 47.0  MCV 86.9  --   PLT 202  --     Lipid Panel: Recent Labs  Lab 08/08/20 0346  CHOL 224*  TRIG 114  HDL 54  CHOLHDL 4.1  VLDL 23  LDLCALC 161*    CBG: Recent Labs  Lab 08/09/20 1643 08/09/20 2107 08/10/20 0617 08/10/20 1157 08/10/20 1655  GLUCAP 229* 200* 208* 231* 284*      Imaging: DG Elbow 2 Views Right  Result Date: 08/08/2020 CLINICAL DATA:  Pain status post fall EXAM: RIGHT ELBOW - 2 VIEW COMPARISON:  None. FINDINGS: There is no evidence of fracture, dislocation, or joint effusion. There is no evidence of arthropathy or other focal bone abnormality. Soft tissues are unremarkable. IMPRESSION: Negative. Electronically Signed   By: Katherine Mantle M.D.   On: 08/08/2020 21:53   CT HEAD WO CONTRAST  Result Date: 08/08/2020 CLINICAL DATA:  Larey Seat off toilet, hit head EXAM: CT HEAD WITHOUT CONTRAST TECHNIQUE: Contiguous axial images were obtained from the base of the skull through the vertex without intravenous contrast. COMPARISON:  08/07/2020, 08/08/2020 FINDINGS: Brain: Stable chronic ischemic changes within the periventricular white matter. No acute infarct or hemorrhage. Lateral ventricles and midline structures are unremarkable. No acute extra-axial fluid collections. No mass effect. Vascular: No hyperdense vessel or unexpected calcification. Skull: Normal. Negative for fracture or focal lesion. Sinuses/Orbits: Stable mucosal thickening within the maxillary sinuses. Other: None. IMPRESSION: 1. Stable chronic small vessel ischemic changes. No acute intracranial process.  Electronically Signed   By: Sharlet Salina M.D.   On: 08/08/2020 22:21   EEG adult  Result Date: 08/10/2020 Rejeana Brock, MD     08/10/2020  1:11 PM Patient Name: Jeremy Lang MRN: [id number] EEG Attending: Ritta Slot Referring Physician/Provider: Uvaldo Bristle Date: 08/10/2020 Duration: 27 minutes Patient history: 54 year old male being evaluated for right-sided weakness Level of alertness: Awake AEDs during EEG study: None Technical aspects: This EEG study was done  with scalp electrodes positioned according to the 10-20 International system of electrode placement. Electrical activity was acquired at a sampling rate of 500Hz  and reviewed with a high frequency filter of 70Hz  and a low frequency filter of 1Hz . EEG data were recorded continuously and digitally stored. BACKGROUND ACTIVITY: Posterior dominant rhythm: The posterior dominant rhythm consists of 11-12 Hz activity of 10 - 15uV seen predominantly in posterior head regions, symmetric and reactive to eye opening and eye closing.        Slowing: None EPILEPTIFORM ACTIVITY: Interictal epileptiform activity: None Ictal Activity: None OTHER EVENTS: None SLEEP RECORDINGS: Only awake and drowsy states were recorded. Drowsiness was characterized by anterior shifting of the posterior background rhythm. ACTIVATION PROCEDURES: Hyperventilation - mild slowing photic stimulation -physiological driving is present IMPRESSION: This study is within normal limits. There was no seizure or evidence of seizure predisposition recorded on this study. Please note that lack of epileptiform activity on EEG does not preclude the possibility of epilepsy.  , MD [facility] [phone] If 7pm- 7am, please page neurology on call as listed in AMION.   DG HIP UNILAT WITH PELVIS 2-3 VIEWS RIGHT  Result Date: 08/08/2020 CLINICAL DATA:  Hip pain EXAM: DG HIP (WITH OR WITHOUT PELVIS) 2-3V RIGHT COMPARISON:  None. FINDINGS: There is  mild bilateral hip osteoarthritis. There is no acute displaced fracture or dislocation. There are multiple calcifications projecting over the patient's pelvis that are favored to represent phleboliths. IMPRESSION: Mild bilateral hip osteoarthritis. No acute displaced fracture or dislocation. Electronically Signed   By: Ritta Slot M.D.   On: 08/08/2020 21:54    Medications:  Scheduled Meds: . insulin aspart  0-15 Units Subcutaneous TID WC  . insulin aspart  0-5 Units Subcutaneous QHS  . insulin glargine  15 Units Subcutaneous QHS  . metFORMIN  500 mg Oral BID WC  . mupirocin ointment   Topical BID  . polyethylene glycol  17 g Oral Daily  . senna-docusate  1 tablet Oral QHS   Continuous Infusions: PRN Meds:.acetaminophen **OR** acetaminophen (TYLENOL) oral liquid 160 mg/5 mL **OR** acetaminophen, senna-docusate  Assessment/Plan   Kivon Aprea is an 54 y.o. male with a past medical history of hemorrhagic stroke, type 2 DM, CAD s/p CABG, schizoaffective disorder, seizure who presented to [facility] ED on 10Nov2021 following an episode of right-sided weakness and slurred speech. Speech appeared to return to normal within 8 hours of arriving to the ED, and its resolution appeared to be sudden (no reports of improving speech). Mr. Doyle reported to me that this reduced weakness is chronic from his previous stroke.  On my visit, Mr. Miley did not endorse any history of seizure, nor does he have any recollection of a "shaking" event. Dr. 12Nov2021 saw the end of the activity and noted just a few seconds of it before it spontaneously resolved. He noted slurred speech afterwards and minimal post ictal period. Mr. Shankland imaging and EEG all came back unremarkable except for a known left-side lacunar infarct. Although it is possible to seize through this lesion, reports of Mr. Mazon hospital course and the lack of abnormal activity on EEG leads me to believe this event was non-organic in  its etiology.  Mr. Depuy had reduced strength of his BUE flexors, 5/5 extensors and limited BLE strength. I suspect this is due to a lack of effort and not his previous stroke as he had noted.   Neurology will sign off for now. Please call with any questions.  Despina Arias.  Markham Jordan, PhD, PA-C [facility] [phone]  08/10/2020, 5:56 PM

## 2020-08-10 NOTE — Progress Notes (Deleted)
Called MRI to inquire about pending test. Spoke with tech who stated they would call this nurse prior to arriving to unit to transport patient to MRI so haldol could be administered. Also stated they should "be able to get to patient following next case"

## 2020-08-10 NOTE — Procedures (Signed)
Patient Name: Jeremy Lang  MRN: [id number]  EEG Attending: Ritta Slot  Referring Physician/Provider: Uvaldo Bristle Date: 08/10/2020 Duration: 27 minutes  Patient history: 54 year old male being evaluated for right-sided weakness  Level of alertness: Awake  AEDs during EEG study: None  Technical aspects: This EEG study was done with scalp electrodes positioned according to the 10-20 International system of electrode placement. Electrical activity was acquired at a sampling rate of 500Hz  and reviewed with a high frequency filter of 70Hz  and a low frequency filter of 1Hz . EEG data were recorded continuously and digitally stored.   BACKGROUND ACTIVITY: Posterior dominant rhythm: The posterior dominant rhythm consists of 11-12 Hz activity of 10 - 15uV seen predominantly in posterior head regions, symmetric and reactive to eye opening and eye closing.          Slowing: None  EPILEPTIFORM ACTIVITY: Interictal epileptiform activity: None  Ictal Activity: None  OTHER EVENTS: None  SLEEP RECORDINGS:  Only awake and drowsy states were recorded. Drowsiness was characterized by anterior shifting of the posterior background rhythm.  ACTIVATION PROCEDURES:  Hyperventilation - mild slowing photic stimulation -physiological driving is present  IMPRESSION: This study is within normal limits. There was no seizure or evidence of seizure predisposition recorded on this study. Please note that lack of epileptiform activity on EEG does not preclude the possibility of epilepsy.    , MD [facility] [phone]  If 7pm- 7am, please page neurology on call as listed in AMION.

## 2020-08-10 NOTE — TOC Progression Note (Signed)
Transition of Care [facility]) - Progression Note    Patient Details  Name: Jeremy Lang MRN: [id number] Date of Birth: 03/29/1966  [facility] of [facility]) CM/SW Contact  Carley Hammed, [place] Phone Number: 08/10/2020, 4:09 PM  Clinical Narrative:    Nurse requested CSW speak with pt as he had questions about SNF placement and insurance. CSW answered questions to the best ability, but may need continued answers. He also had questions about benefits that CSW was not able to answer. SW will continue to follow.   Expected Discharge Plan: Skilled Nursing Facility Barriers to Discharge: Continued Medical Work up  Expected Discharge Plan and Services Expected Discharge Plan: Skilled Nursing Facility     Post Acute Care Choice: Skilled Nursing Facility Living arrangements for the past 2 months: Apartment                                       Social Determinants of Health (SDOH) Interventions    Readmission Risk Interventions No flowsheet data found.

## 2020-08-10 NOTE — Progress Notes (Addendum)
EEG complete - results pending 

## 2020-08-11 LAB — GLUCOSE, CAPILLARY
Glucose-Capillary: 233 mg/dL — ABNORMAL HIGH (ref 70–99)
Glucose-Capillary: 160 mg/dL — ABNORMAL HIGH (ref 70–99)
Glucose-Capillary: 191 mg/dL — ABNORMAL HIGH (ref 70–99)
Glucose-Capillary: 306 mg/dL — ABNORMAL HIGH (ref 70–99)

## 2020-08-11 NOTE — Progress Notes (Signed)
Up in chair after working with PT and OT this morning. Sat up through lunch time with no complaints. Stand by assist while patient ambulated to restroom and had a bm. Used rolling walker correctly. Did not require any other assistance than stand by

## 2020-08-11 NOTE — Progress Notes (Signed)
[facility]  PROGRESS NOTE  Rawlin Reaume NOT:[id number] DOB: [birth year] DOA: 08/07/2020 PCP: Patient, No Pcp Per   Brief HPI:   54 year old male with history of diabetes mellitus type 2, CAD s/p CABG, seizures, prior hemorrhagic liver infarct, schizoaffective disorder came to ED for evaluation of acute right-sided weakness.  Patient states that he had new onset right upper and lower extremity weakness with difficulty speaking.  CT head showed no acute infarct.  CTA head and neck was negative for large vessel occlusion.  Neurology was consulted, MRI brain and EEG were recommended.    Subjective   Patient seen and examined, complains of blurred vision last night, CT head was unremarkable.  Patient has been having high blood glucose readings due to insistence on eating regular diet.   Assessment/Plan:     1. Right-sided weakness-patient's exam is not consistent, also documented by neurology that patient likely has psychogenic component.  MRI brain is negative for stroke.  EEG is negative.  Neurology has signed off. 2. Right diabetic foot ulcer-wound care was consulted, and recommended podiatry consultation.  Podiatry was consulted, they want to follow-up as outpatient.  He will need a better footwear to avoid possibility of infection and injury to limb loss. 3. Schizoaffective disorder-patient has schizoaffective disorder, currently on no medications.  He presented with psychogenic seizure/right-sided weakness.  Psychiatry was consulted, they did not feel that patient needs any acute therapy in the hospital.  Patient can follow-up outpatient psychiatry for in-depth evaluation.  Patient is cleared for discharge as per psychiatry.   4. CAD s/p CABG-patient reports prior CABG in 2009, currently on no medications. 5. Diabetes mellitus type 2-CBGs elevated, continue sensitive sliding scale insulin with NovoLog.  Hemoglobin A1c is 8.2.    Dose of Lantus was increased  to 15 units subcu daily.   Continue moderate sliding scale insulin.  We will switch him back to carb modified diet, as patient is eating regular diet and his blood glucose has been significantly elevated.  Patient agrees to start back on carb modified diet. 6. Seizure-like activity-patient had seizure-like activity in the ED while in the CT scanner.  He received IV Keppra and Ativan.  Neurology saw the patient and it seems that he had a psychogenic seizure.  No further antiepileptic medications recommended.  EEG is negative for epileptiform discharges.     COVID-19 Labs  No results for input(s): DDIMER, FERRITIN, LDH, CRP in the last 72 hours.  Lab Results  Component Value Date   SARSCOV2NAA NEGATIVE 08/07/2020     Scheduled medications:   . insulin aspart  0-15 Units Subcutaneous TID WC  . insulin aspart  0-5 Units Subcutaneous QHS  . insulin glargine  15 Units Subcutaneous QHS  . lidocaine  1 patch Transdermal QHS  . metFORMIN  500 mg Oral BID WC  . mupirocin ointment   Topical BID  . polyethylene glycol  17 g Oral Daily  . senna-docusate  1 tablet Oral QHS         CBG: Recent Labs  Lab 08/10/20 1157 08/10/20 1655 08/10/20 2125 08/11/20 0622 08/11/20 1214  GLUCAP 231* 284* 152* 233* 160*    SpO2: 97 %    CBC: Recent Labs  Lab 08/07/20 1857 08/07/20 1858  WBC 10.4  --   NEUTROABS 6.6  --   HGB 15.4 16.0  HCT 47.0 47.0  MCV 86.9  --   PLT 202  --     Basic Metabolic Panel: Recent Labs  Lab 08/07/20  1857 08/07/20 1858  NA 135 137  K 3.8 3.7  CL 101 99  CO2 25  --   GLUCOSE 72 68*  BUN 13 15  CREATININE 1.06 1.00  CALCIUM 8.9  --      Liver Function Tests: Recent Labs  Lab 08/07/20 1857  AST 18  ALT 18  ALKPHOS 84  BILITOT 1.1  PROT 6.8  ALBUMIN 3.3*     Antibiotics: Anti-infectives (From admission, onward)   None       DVT prophylaxis: Lovenox  Code Status: Full code  Family Communication: No family at  bedside   Consultants:  Neurology  Procedures:      Objective   Vitals:   08/11/20 0026 08/11/20 0400 08/11/20 1039 08/11/20 1200  BP: (!) 99/59 137/82 (!) 141/77 127/74  Pulse: 85 77 77 78  Resp: 19 18 20 18   Temp: 99.5 F (37.5 C) 99.9 F (37.7 C) 98.7 F (37.1 C) 99 F (37.2 C)  TempSrc: Oral Oral Oral Oral  SpO2: 98% 98% 99% 97%  Weight:      Height:        Intake/Output Summary (Last 24 hours) at 08/11/2020 1326 Last data filed at 08/11/2020 1313 Gross per 24 hour  Intake 356 ml  Output 1075 ml  Net -719 ml    11/12 1901 - 11/14 0700 In: 356 [P.O.:356] Out: 2150 [Urine:2150]  Filed Weights   08/07/20 1932  Weight: 106.6 kg    Physical Examination:   General-appears in no acute distress Heart-S1-S2, regular, no murmur auscultated Lungs-clear to auscultation bilaterally, no wheezing or crackles auscultated Abdomen-soft, nontender, no organomegaly Extremities-no edema in the lower extremities Neuro-alert, oriented x3, no focal deficit noted  Status is: Inpatient  Dispo: The patient is from: Home              Anticipated d/c is to: Skilled nursing facility              Anticipated d/c date is:  08/12/2020              Patient currently not medically stable for discharge  Barrier to discharge-ongoing  evaluation for stroke, right foot ulcer, unsafe discharge due to lack of insight and schizoaffective disorder. Psych consulted.      Data Reviewed:   Recent Results (from the past 240 hour(s))  Resp Panel by RT PCR (RSV, Flu A&B, Covid) - Nasopharyngeal Swab     Status: None   Collection Time: 08/07/20  7:43 PM   Specimen: Nasopharyngeal Swab  Result Value Ref Range Status   SARS Coronavirus 2 by RT PCR NEGATIVE NEGATIVE Final    Comment: (NOTE) SARS-CoV-2 target nucleic acids are NOT DETECTED.  The SARS-CoV-2 RNA is generally detectable in upper respiratoy specimens during the acute phase of infection. The lowest concentration of  SARS-CoV-2 viral copies this assay can detect is 131 copies/mL. A negative result does not preclude SARS-Cov-2 infection and should not be used as the sole basis for treatment or other patient management decisions. A negative result may occur with  improper specimen collection/handling, submission of specimen other than nasopharyngeal swab, presence of viral mutation(s) within the areas targeted by this assay, and inadequate number of viral copies (<131 copies/mL). A negative result must be combined with clinical observations, patient history, and epidemiological information. The expected result is Negative.  Fact Sheet for Patients:  https://www.moore.com/https://www.fda.gov/media/142436/download  Fact Sheet for Healthcare Providers:  https://www.young.biz/https://www.fda.gov/media/142435/download  This test is no t yet approved or cleared by  the [employer] and  has been authorized for detection and/or diagnosis of SARS-CoV-2 by FDA under an Emergency Use Authorization (EUA). This EUA will remain  in effect (meaning this test can be used) for the duration of the COVID-19 declaration under Section 564(b)(1) of the Act, 21 U.S.C. section 360bbb-3(b)(1), unless the authorization is terminated or revoked sooner.     Influenza A by PCR NEGATIVE NEGATIVE Final   Influenza B by PCR NEGATIVE NEGATIVE Final    Comment: (NOTE) The Xpert Xpress SARS-CoV-2/FLU/RSV assay is intended as an aid in  the diagnosis of influenza from Nasopharyngeal swab specimens and  should not be used as a sole basis for treatment. Nasal washings and  aspirates are unacceptable for Xpert Xpress SARS-CoV-2/FLU/RSV  testing.  Fact Sheet for Patients: https://www.moore.com/  Fact Sheet for Healthcare Providers: https://www.young.biz/  This test is not yet approved or cleared by the [place] FDA and  has been authorized for detection and/or diagnosis of SARS-CoV-2 by  FDA under an Emergency Use  Authorization (EUA). This EUA will remain  in effect (meaning this test can be used) for the duration of the  Covid-19 declaration under Section 564(b)(1) of the Act, 21  U.S.C. section 360bbb-3(b)(1), unless the authorization is  terminated or revoked.    Respiratory Syncytial Virus by PCR NEGATIVE NEGATIVE Final    Comment: (NOTE) Fact Sheet for Patients: https://www.moore.com/  Fact Sheet for Healthcare Providers: https://www.young.biz/  This test is not yet approved or cleared by the [place] FDA and  has been authorized for detection and/or diagnosis of SARS-CoV-2 by  FDA under an Emergency Use Authorization (EUA). This EUA will remain  in effect (meaning this test can be used) for the duration of the  COVID-19 declaration under Section 564(b)(1) of the Act, 21 U.S.C.  section 360bbb-3(b)(1), unless the authorization is terminated or  revoked. Performed at [facility], [address] [place], [place] [address]     No results for input(s): LIPASE, AMYLASE in the last 168 hours. No results for input(s): AMMONIA in the last 168 hours.  Cardiac Enzymes: No results for input(s): CKTOTAL, CKMB, CKMBINDEX, TROPONINI in the last 168 hours. BNP (last 3 results) No results for input(s): BNP in the last 8760 hours.  ProBNP (last 3 results) No results for input(s): PROBNP in the last 8760 hours.  Studies:  CT HEAD WO CONTRAST  Result Date: 08/10/2020 CLINICAL DATA:  Right upper and lower extremity weakness, dysarthria EXAM: CT HEAD WITHOUT CONTRAST TECHNIQUE: Contiguous axial images were obtained from the base of the skull through the vertex without intravenous contrast. COMPARISON:  08/08/2020, 08/07/2020 FINDINGS: Brain: Normal anatomic configuration. Parenchymal volume loss is commensurate with the patient's age. Mild periventricular white matter changes are present likely reflecting the sequela of small vessel ischemia. Stable  remote infarct of the left putamen and corona radiata. No abnormal intra or extra-axial mass lesion or fluid collection. No abnormal mass effect or midline shift. No evidence of acute intracranial hemorrhage or infarct. Ventricular size is normal. Cerebellum unremarkable. Vascular: No asymmetric hyperdense vasculature at the skull base. Skull: Intact Sinuses/Orbits: Mild right and moderate left maxillary sinus mucosal thickening is noted. No air-fluid levels. Remaining paranasal sinuses are clear. Orbits are unremarkable. Other: Mastoid air cells and middle ear cavities are clear. IMPRESSION: Stable remote left basal ganglia/corona radiata infarct. No evidence of acute intracranial hemorrhage or infarct. Bilateral maxillary sinus disease, left greater than right. Electronically Signed   By: Helyn Numbers MD  On: 08/10/2020 20:52   EEG adult  Result Date: 08/10/2020 Rejeana Brock, MD     08/10/2020  1:11 PM Patient Name: Chino Sardo MRN: [id number] EEG Attending: Ritta Slot Referring Physician/Provider: Uvaldo Bristle Date: 08/10/2020 Duration: 27 minutes Patient history: 54 year old male being evaluated for right-sided weakness Level of alertness: Awake AEDs during EEG study: None Technical aspects: This EEG study was done with scalp electrodes positioned according to the 10-20 International system of electrode placement. Electrical activity was acquired at a sampling rate of 500Hz  and reviewed with a high frequency filter of 70Hz  and a low frequency filter of 1Hz . EEG data were recorded continuously and digitally stored. BACKGROUND ACTIVITY: Posterior dominant rhythm: The posterior dominant rhythm consists of 11-12 Hz activity of 10 - 15uV seen predominantly in posterior head regions, symmetric and reactive to eye opening and eye closing.        Slowing: None EPILEPTIFORM ACTIVITY: Interictal epileptiform activity: None Ictal Activity: None OTHER EVENTS: None SLEEP RECORDINGS: Only awake  and drowsy states were recorded. Drowsiness was characterized by anterior shifting of the posterior background rhythm. ACTIVATION PROCEDURES: Hyperventilation - mild slowing photic stimulation -physiological driving is present IMPRESSION: This study is within normal limits. There was no seizure or evidence of seizure predisposition recorded on this study. Please note that lack of epileptiform activity on EEG does not preclude the possibility of epilepsy.  , MD [facility] [phone] If 7pm- 7am, please page neurology on call as listed in AMION.         [facility] If 7PM-7AM, please contact night-coverage at [email or web address], Office  [phone]   08/11/2020, 1:26 PM  LOS: 3 days

## 2020-08-11 NOTE — Evaluation (Signed)
Occupational Therapy Evaluation Patient Details Name: Jeremy Lang MRN: [id number] DOB: 01/09/1966 Today's Date: 08/11/2020    History of Present Illness Pt is a 54 year old male who presented with impaired speech, R facial droop, and R sided weakness. He also displayed a seizure like episode. CT and MRI were negative for any acute abnormalities. NIHSS of 5-6. PMH significant for DM2, CAD, s/p CABG, seizures, prior hemorrhagic lacunar infarct, and schizoaffective disorder.   Clinical Impression   Pt admitted with above. He demonstrates the below listed deficits and will benefit from continued OT to maximize safety and independence with BADLs.  Pt presents with limited Rt shoulder ROM which he reports is secondary to old crush injury of humerus; impaired standing balance, and blurry vision which he reports is worsening.  He requires min A for ADLs and min A +2 for safety with functional mobility.  He reports he lives with 2 roommates and was mod I with ADLs.  Due to lack of support at discharge, recommend SNF level rehab.       Follow Up Recommendations  SNF;Supervision/Assistance - 24 hour    Equipment Recommendations  None recommended by OT    Recommendations for Other Services       Precautions / Restrictions Precautions Precautions: Fall Precaution Comments: seizure like movements - EEG negative       Mobility Bed Mobility Overal bed mobility: Needs Assistance Bed Mobility: Supine to Sit     Supine to sit: HOB elevated;Min guard     General bed mobility comments: Pt sitting up on chair     Transfers Overall transfer level: Needs assistance Equipment used: Rolling walker (2 wheeled) Transfers: Sit to/from Stand Sit to Stand: Min assist;+2 safety/equipment         General transfer comment: assist to stand    Balance Overall balance assessment: Needs assistance Sitting-balance support: No upper extremity supported;Feet supported Sitting balance-Leahy Scale:  Normal Sitting balance - Comments: able to don socks without difficulty    Standing balance support: Bilateral upper extremity supported;During functional activity Standing balance-Leahy Scale: Poor Standing balance comment: reliant on BUE support                           ADL either performed or assessed with clinical judgement   ADL Overall ADL's : Needs assistance/impaired Eating/Feeding: Modified independent;Sitting   Grooming: Wash/dry hands;Wash/dry face;Oral care;Brushing hair;Set up;Sitting   Upper Body Bathing: Set up;Sitting   Lower Body Bathing: Minimal assistance;Sit to/from stand Lower Body Bathing Details (indicate cue type and reason): due to balance  Upper Body Dressing : Set up;Sitting   Lower Body Dressing: Minimal assistance;Sit to/from stand   Toilet Transfer: Minimal assistance;Ambulation;Comfort height toilet   Toileting- Clothing Manipulation and Hygiene: Minimal assistance;Sit to/from stand       Functional mobility during ADLs: Minimal assistance;+2 for safety/equipment       Vision Baseline Vision/History: Wears glasses Wears Glasses: At all times Additional Comments: Pt reports new onset of blurry vision (with this admission) which he reports is getting worse daily, and he is now seeing blackness.  He became very irritable talking about his vision, and that no one is addressing it.  Attempted visual assessment, however, pt would not attempt tracking - held eyes at midline.  He does have full EOMs noted during observation.  He was able to locate his phone without difficulty, but insists that he can't read anything on it, but was able to locate screen  icons to open his phone and change screens - will benefit from further assessment through function      Perception Perception Perception Tested?: Yes   Praxis Praxis Praxis tested?: Within functional limits    Pertinent Vitals/Pain Pain Assessment: No/denies pain     Hand Dominance  Right   Extremity/Trunk Assessment Upper Extremity Assessment Upper Extremity Assessment: RUE deficits/detail RUE Deficits / Details: Pt reports crush injury to Rt shoulder with subsequently limited AROM Rt shoulder.  Attempts to assess PROM unsuccessful as pt actively resisted movement  RUE Coordination: decreased gross motor   Lower Extremity Assessment Lower Extremity Assessment: Defer to PT evaluation   Cervical / Trunk Assessment Cervical / Trunk Assessment: Normal   Communication Communication Communication: No difficulties   Cognition Arousal/Alertness: Awake/alert Behavior During Therapy: Impulsive (irritable ) Overall Cognitive Status: Difficult to assess                                 General Comments: Pt is very tangential and self distracts frequently.  Formal cognitive assessment difficult at this time.  He follows commands consistently, and is oriented x 4    General Comments       Exercises     Shoulder Instructions      Home Living Family/patient expects to be discharged to:: Private residence Living Arrangements: Other relatives   Type of Home: House Home Access: Stairs to enter Secretary/administrator of Steps: 5 Entrance Stairs-Rails: Right Home Layout: One level     Bathroom Shower/Tub: Chief Strategy Officer: Standard Bathroom Accessibility: Yes   Home Equipment: Environmental consultant - 2 wheels   Additional Comments: Pt reports to OT that he lives in a boarding house with two roommates       Prior Functioning/Environment Level of Independence: Independent with assistive device(s)        Comments: Pt reports he does not drive         OT Problem List: Decreased strength;Decreased range of motion;Decreased activity tolerance;Impaired balance (sitting and/or standing);Impaired vision/perception;Decreased coordination;Decreased safety awareness;Decreased knowledge of use of DME or AE;Impaired UE functional use      OT  Treatment/Interventions: Self-care/ADL training;DME and/or AE instruction;Therapeutic activities;Cognitive remediation/compensation;Visual/perceptual remediation/compensation;Patient/family education;Balance training    OT Goals(Current goals can be found in the care plan section) Acute Rehab OT Goals Patient Stated Goal: to figure out why vision is blurry  OT Goal Formulation: With patient Time For Goal Achievement: 08/25/20 Potential to Achieve Goals: Good ADL Goals Pt Will Perform Grooming: (P) with min guard assist;standing Pt Will Perform Upper Body Bathing: (P) with set-up;sitting Pt Will Perform Lower Body Bathing: (P) with min guard assist;sit to/from stand Pt Will Perform Upper Body Dressing: (P) with set-up;sitting Pt Will Perform Lower Body Dressing: (P) with min guard assist;sit to/from stand Pt Will Transfer to Toilet: (P) with min guard assist;ambulating;regular height toilet;bedside commode;grab bars Pt Will Perform Toileting - Clothing Manipulation and hygiene: (P) with min guard assist;sit to/from stand Additional ADL Goal #1: (P) Pt will independently use phone and locate needed ADL items  OT Frequency: Min 2X/week   Barriers to D/C: Decreased caregiver support  Pt reports roommates are unable to assist him        Co-evaluation              AM-PAC OT "6 Clicks" Daily Activity     Outcome Measure Help from another person eating meals?: None Help from another person  taking care of personal grooming?: A Little Help from another person toileting, which includes using toliet, bedpan, or urinal?: A Little Help from another person bathing (including washing, rinsing, drying)?: A Little Help from another person to put on and taking off regular upper body clothing?: A Little Help from another person to put on and taking off regular lower body clothing?: A Little 6 Click Score: 19   End of Session Nurse Communication: Mobility status  Activity Tolerance: Patient  tolerated treatment well Patient left: in chair;with call bell/phone within reach  OT Visit Diagnosis: Unsteadiness on feet (R26.81);Low vision, both eyes (H54.2)                Time: 4196-2229 OT Time Calculation (min): 31 min Charges:  OT General Charges $OT Visit: 1 Visit OT Evaluation $OT Eval Moderate Complexity: 1 Mod OT Treatments $Self Care/Home Management : 8-22 mins  Eber Jones OTR/L Acute Rehabilitation Services Pager [phone] Office [phone]   Jeani Hawking M 08/11/2020, 12:41 PM

## 2020-08-11 NOTE — Progress Notes (Signed)
Physical Therapy Treatment Patient Details Name: Jeremy Lang MRN: [id number] DOB: 04-11-1966 Today's Date: 08/11/2020    History of Present Illness Pt is a 54 year old male who presented with impaired speech, R facial droop, and R sided weakness. He also displayed a seizure like episode. CT and MRI were negative for any acute abnormalities. NIHSS of 5-6. PMH significant for DM2, CAD, s/p CABG, seizures, prior hemorrhagic lacunar infarct, and schizoaffective disorder.    PT Comments    Pt received in bed, agreeable to participation in therapy. He required min guard assist bed mobility, min assist transfers, and min assist ambulation 150' x 2 with RW +2 safety/chair follow. Seated rest break between gait trials. Pt with LOB x 1 at end of second gait trial, requiring mod assist to maintain balance for immediate transition into recliner.  Pt in recliner with feet elevated at end of session.   Follow Up Recommendations  SNF;Supervision for mobility/OOB     Equipment Recommendations  Rolling walker with 5" wheels;3in1 (PT);Wheelchair (measurements PT);Wheelchair cushion (measurements PT)    Recommendations for Other Services       Precautions / Restrictions Precautions Precautions: Fall;Other (comment) Precaution Comments: seizures    Mobility  Bed Mobility Overal bed mobility: Needs Assistance Bed Mobility: Supine to Sit     Supine to sit: HOB elevated;Min guard     General bed mobility comments: +rail, increased time and effort  Transfers Overall transfer level: Needs assistance Equipment used: Rolling walker (2 wheeled) Transfers: Sit to/from Stand Sit to Stand: +2 safety/equipment;Min assist         General transfer comment: assist to power up, increased time and effort  Ambulation/Gait Ambulation/Gait assistance: Min assist;+2 safety/equipment Gait Distance (Feet): 150 Feet (x 2) Assistive device: Rolling walker (2 wheeled) Gait Pattern/deviations: Step-to  pattern;Decreased step length - right;Staggering left Gait velocity: decreased Gait velocity interpretation: <1.31 ft/sec, indicative of household ambulator General Gait Details: Cues to stay close to RW. Assist with L lateral weight shift for improved foot clearance on R. Seated rest break between gait trials. During 2nd gait trial, pt pushing RW toward L, running into obstacles. Pt able to maintain straight path during 1st gait trial. Just a few feet before reaching his room (where his RN was sitting), pt with LOB/heavy lean toward L. He required mod assist to maintain stance and immediately assisted to recliner, as tech had followed with recliner. No LOC. When asked what happened, pt stated "I don't know. I just got woozy." HR 88 in recliner.   Stairs             Wheelchair Mobility    Modified Rankin (Stroke Patients Only)       Balance Overall balance assessment: Needs assistance Sitting-balance support: No upper extremity supported;Feet supported Sitting balance-Leahy Scale: Normal Sitting balance - Comments: Pt able to don pants and shoes EOB.   Standing balance support: Bilateral upper extremity supported;During functional activity Standing balance-Leahy Scale: Poor Standing balance comment: reliant on BUE support                            Cognition Arousal/Alertness: Awake/alert Behavior During Therapy: Impulsive (unpredictable) Overall Cognitive Status: History of cognitive impairments - at baseline                                        Exercises  General Comments        Pertinent Vitals/Pain Pain Assessment: No/denies pain    Home Living                      Prior Function            PT Goals (current goals can now be found in the care plan section) Acute Rehab PT Goals Patient Stated Goal: not stated Progress towards PT goals: Progressing toward goals    Frequency    Min 3X/week      PT Plan  Current plan remains appropriate    Co-evaluation              AM-PAC PT "6 Clicks" Mobility   Outcome Measure  Help needed turning from your back to your side while in a flat bed without using bedrails?: A Little Help needed moving from lying on your back to sitting on the side of a flat bed without using bedrails?: A Little Help needed moving to and from a bed to a chair (including a wheelchair)?: A Little Help needed standing up from a chair using your arms (e.g., wheelchair or bedside chair)?: A Little Help needed to walk in hospital room?: A Little Help needed climbing 3-5 steps with a railing? : A Lot 6 Click Score: 17    End of Session Equipment Utilized During Treatment: Gait belt Activity Tolerance: Patient tolerated treatment well Patient left: in chair;with call bell/phone within reach;with chair alarm set Nurse Communication: Mobility status PT Visit Diagnosis: Unsteadiness on feet (R26.81);Other abnormalities of gait and mobility (R26.89);Muscle weakness (generalized) (M62.81);Difficulty in walking, not elsewhere classified (R26.2);Other symptoms and signs involving the nervous system (Z61.096)     Time: 0454-0981 PT Time Calculation (min) (ACUTE ONLY): 29 min  Charges:  $Gait Training: 23-37 mins                     Aida Raider, [place]  Office # [phone] Pager [phone]    Ilda Foil 08/11/2020, 10:08 AM

## 2020-08-12 LAB — GLUCOSE, CAPILLARY
Glucose-Capillary: 187 mg/dL — ABNORMAL HIGH (ref 70–99)
Glucose-Capillary: 156 mg/dL — ABNORMAL HIGH (ref 70–99)
Glucose-Capillary: 236 mg/dL — ABNORMAL HIGH (ref 70–99)
Glucose-Capillary: 295 mg/dL — ABNORMAL HIGH (ref 70–99)

## 2020-08-12 LAB — CBC
HCT: 44.5 % (ref 39.0–52.0)
Hemoglobin: 15.2 g/dL (ref 13.0–17.0)
MCH: 29 pg (ref 26.0–34.0)
MCHC: 34.2 g/dL (ref 30.0–36.0)
MCV: 84.9 fL (ref 80.0–100.0)
Platelets: 158 10*3/uL (ref 150–400)
RBC: 5.24 MIL/uL (ref 4.22–5.81)
RDW: 13.7 % (ref 11.5–15.5)
WBC: 12.1 10*3/uL — ABNORMAL HIGH (ref 4.0–10.5)
nRBC: 0 % (ref 0.0–0.2)

## 2020-08-12 LAB — BASIC METABOLIC PANEL
Anion gap: 9 (ref 5–15)
BUN: 16 mg/dL (ref 6–20)
CO2: 25 mmol/L (ref 22–32)
Calcium: 9 mg/dL (ref 8.9–10.3)
Chloride: 96 mmol/L — ABNORMAL LOW (ref 98–111)
Creatinine, Ser: 1.02 mg/dL (ref 0.61–1.24)
GFR, Estimated: >60 mL/min (ref >60)
Glucose, Bld: 170 mg/dL — ABNORMAL HIGH (ref 70–99)
Potassium: 4.3 mmol/L (ref 3.5–5.1)
Sodium: 130 mmol/L — ABNORMAL LOW (ref 135–145)

## 2020-08-12 MED ORDER — ONDANSETRON HCL 4 MG/2ML IJ SOLN
4.0000 mg | Freq: Four times a day (QID) | INTRAMUSCULAR | Status: DC | PRN
  Administered 2020-08-12: 4 mg via INTRAVENOUS
  Filled 2020-08-12: qty 2

## 2020-08-12 NOTE — Progress Notes (Signed)
[facility]  PROGRESS NOTE  Jeremy Lang XNA:[id number] DOB: November 01, 1965 DOA: 08/07/2020 PCP: Patient, No Pcp Per   Brief HPI:   54 year old male with history of diabetes mellitus type 2, CAD s/p CABG, seizures, prior hemorrhagic liver infarct, schizoaffective disorder came to ED for evaluation of acute right-sided weakness.  Patient states that he had new onset right upper and lower extremity weakness with difficulty speaking.  CT head showed no acute infarct.  CTA head and neck was negative for large vessel occlusion.  Neurology was consulted, MRI brain and EEG were recommended.    Subjective   Patient seen and examined, denies any complaints.  Awaiting to go to rehab.  Blood glucose is better controlled after changing to carb modified diet.   Assessment/Plan:     1. Right-sided weakness-patient's exam is not consistent, also documented by neurology that patient likely has psychogenic component.  MRI brain is negative for stroke.  EEG is negative.  Neurology has signed off. 2. Right diabetic foot ulcer-wound care was consulted, and recommended podiatry consultation.  Podiatry was consulted, they want to follow-up as outpatient.  He will need a better footwear to avoid possibility of infection and injury to limb loss. 3. Schizoaffective disorder-patient has schizoaffective disorder, currently on no medications.  He presented with psychogenic seizure/right-sided weakness.  Psychiatry was consulted, they did not feel that patient needs any acute therapy in the hospital.  Patient can follow-up outpatient psychiatry for in-depth evaluation.  Patient is cleared for discharge as per psychiatry.   4. CAD s/p CABG-patient reports prior CABG in 2009, currently on no medications. 5. Diabetes mellitus type 2-CBGs elevated, continue sensitive sliding scale insulin with NovoLog.  Hemoglobin A1c is 8.2.    Dose of Lantus was increased  to 15 units subcu daily.  Continue moderate sliding scale  insulin.  Patient blood glucose was significantly elevated, blood glucose is better controlled now after switching to carb modified diet.  We will continue to monitor patient's blood glucose at current regimen.   6. Seizure-like activity-patient had seizure-like activity in the ED while in the CT scanner.  He received IV Keppra and Ativan.  Neurology saw the patient and it seems that he had a psychogenic seizure.  No further antiepileptic medications recommended.  EEG is negative for epileptiform discharges. 7. Hyponatremia-likely pseudohyponatremia from hyperglycemia, follow BMP in am.     COVID-19 Labs  No results for input(s): DDIMER, FERRITIN, LDH, CRP in the last 72 hours.  Lab Results  Component Value Date   SARSCOV2NAA NEGATIVE 08/07/2020     Scheduled medications:   . insulin aspart  0-15 Units Subcutaneous TID WC  . insulin aspart  0-5 Units Subcutaneous QHS  . insulin glargine  15 Units Subcutaneous QHS  . lidocaine  1 patch Transdermal QHS  . metFORMIN  500 mg Oral BID WC  . mupirocin ointment   Topical BID  . polyethylene glycol  17 g Oral Daily  . senna-docusate  1 tablet Oral QHS         CBG: Recent Labs  Lab 08/11/20 1214 08/11/20 1638 08/11/20 2130 08/12/20 0615 08/12/20 1212  GLUCAP 160* 191* 306* 187* 156*    SpO2: 97 %    CBC: Recent Labs  Lab 08/07/20 1857 08/07/20 1858 08/12/20 1038  WBC 10.4  --  12.1*  NEUTROABS 6.6  --   --   HGB 15.4 16.0 15.2  HCT 47.0 47.0 44.5  MCV 86.9  --  84.9  PLT 202  --  158    Basic Metabolic Panel: Recent Labs  Lab 08/07/20 1857 08/07/20 1858 08/12/20 1038  NA 135 137 130*  K 3.8 3.7 4.3  CL 101 99 96*  CO2 25  --  25  GLUCOSE 72 68* 170*  BUN 13 15 16   CREATININE 1.06 1.00 1.02  CALCIUM 8.9  --  9.0     Liver Function Tests: Recent Labs  Lab 08/07/20 1857  AST 18  ALT 18  ALKPHOS 84  BILITOT 1.1  PROT 6.8  ALBUMIN 3.3*     Antibiotics: Anti-infectives (From admission,  onward)   None       DVT prophylaxis: Lovenox  Code Status: Full code  Family Communication: No family at bedside   Consultants:  Neurology  Procedures:      Objective   Vitals:   08/12/20 0400 08/12/20 0800 08/12/20 0828 08/12/20 1210  BP: 119/62  (!) 109/54 (!) 104/54  Pulse: 74  64 67  Resp: 20 16 16 20   Temp: 98.5 F (36.9 C)  97.8 F (36.6 C) 98.5 F (36.9 C)  TempSrc: Oral  Oral Oral  SpO2: 99% 99% 98% 97%  Weight:      Height:        Intake/Output Summary (Last 24 hours) at 08/12/2020 1510 Last data filed at 08/12/2020 1237 Gross per 24 hour  Intake 360 ml  Output 800 ml  Net -440 ml    11/13 1901 - 11/15 0700 In: 476 [P.O.:476] Out: 975 [Urine:975]  Filed Weights   08/07/20 1932  Weight: 106.6 kg    Physical Examination:   General-appears in no acute distress Heart-S1-S2, regular, no murmur auscultated Lungs-clear to auscultation bilaterally, no wheezing or crackles auscultated Abdomen-soft, nontender, no organomegaly Extremities-no edema in the lower extremities Neuro-alert, oriented x3, no focal deficit noted  Status is: Inpatient  Dispo: The patient is from: Home              Anticipated d/c is to: Skilled nursing facility              Anticipated d/c date is:  08/13/2020              Patient currently  medically stable for discharge  Barrier to discharge-awaiting bed at skilled nursing facility      Data Reviewed:   Recent Results (from the past 240 hour(s))  Resp Panel by RT PCR (RSV, Flu A&B, Covid) - Nasopharyngeal Swab     Status: None   Collection Time: 08/07/20  7:43 PM   Specimen: Nasopharyngeal Swab  Result Value Ref Range Status   SARS Coronavirus 2 by RT PCR NEGATIVE NEGATIVE Final    Comment: (NOTE) SARS-CoV-2 target nucleic acids are NOT DETECTED.  The SARS-CoV-2 RNA is generally detectable in upper respiratoy specimens during the acute phase of infection. The lowest concentration of SARS-CoV-2 viral  copies this assay can detect is 131 copies/mL. A negative result does not preclude SARS-Cov-2 infection and should not be used as the sole basis for treatment or other patient management decisions. A negative result may occur with  improper specimen collection/handling, submission of specimen other than nasopharyngeal swab, presence of viral mutation(s) within the areas targeted by this assay, and inadequate number of viral copies (<131 copies/mL). A negative result must be combined with clinical observations, patient history, and epidemiological information. The expected result is Negative.  Fact Sheet for Patients:  08/15/2020  Fact Sheet for Healthcare Providers:  13/10/21  This test is no t  yet approved or cleared by the [place] and  has been authorized for detection and/or diagnosis of SARS-CoV-2 by FDA under an Emergency Use Authorization (EUA). This EUA will remain  in effect (meaning this test can be used) for the duration of the COVID-19 declaration under Section 564(b)(1) of the Act, 21 U.S.C. section 360bbb-3(b)(1), unless the authorization is terminated or revoked sooner.     Influenza A by PCR NEGATIVE NEGATIVE Final   Influenza B by PCR NEGATIVE NEGATIVE Final    Comment: (NOTE) The Xpert Xpress SARS-CoV-2/FLU/RSV assay is intended as an aid in  the diagnosis of influenza from Nasopharyngeal swab specimens and  should not be used as a sole basis for treatment. Nasal washings and  aspirates are unacceptable for Xpert Xpress SARS-CoV-2/FLU/RSV  testing.  Fact Sheet for Patients: https://www.moore.com/  Fact Sheet for Healthcare Providers: https://www.young.biz/  This test is not yet approved or cleared by the [place] FDA and  has been authorized for detection and/or diagnosis of SARS-CoV-2 by  FDA under an Emergency Use Authorization (EUA). This  EUA will remain  in effect (meaning this test can be used) for the duration of the  Covid-19 declaration under Section 564(b)(1) of the Act, 21  U.S.C. section 360bbb-3(b)(1), unless the authorization is  terminated or revoked.    Respiratory Syncytial Virus by PCR NEGATIVE NEGATIVE Final    Comment: (NOTE) Fact Sheet for Patients: https://www.moore.com/  Fact Sheet for Healthcare Providers: https://www.young.biz/  This test is not yet approved or cleared by the [place] FDA and  has been authorized for detection and/or diagnosis of SARS-CoV-2 by  FDA under an Emergency Use Authorization (EUA). This EUA will remain  in effect (meaning this test can be used) for the duration of the  COVID-19 declaration under Section 564(b)(1) of the Act, 21 U.S.C.  section 360bbb-3(b)(1), unless the authorization is terminated or  revoked. Performed at [facility], [address] [place], [place] [address]     No results for input(s): LIPASE, AMYLASE in the last 168 hours. No results for input(s): AMMONIA in the last 168 hours.  Cardiac Enzymes: No results for input(s): CKTOTAL, CKMB, CKMBINDEX, TROPONINI in the last 168 hours. BNP (last 3 results) No results for input(s): BNP in the last 8760 hours.  ProBNP (last 3 results) No results for input(s): PROBNP in the last 8760 hours.  Studies:  CT HEAD WO CONTRAST  Result Date: 08/10/2020 CLINICAL DATA:  Right upper and lower extremity weakness, dysarthria EXAM: CT HEAD WITHOUT CONTRAST TECHNIQUE: Contiguous axial images were obtained from the base of the skull through the vertex without intravenous contrast. COMPARISON:  08/08/2020, 08/07/2020 FINDINGS: Brain: Normal anatomic configuration. Parenchymal volume loss is commensurate with the patient's age. Mild periventricular white matter changes are present likely reflecting the sequela of small vessel ischemia. Stable remote infarct of the left  putamen and corona radiata. No abnormal intra or extra-axial mass lesion or fluid collection. No abnormal mass effect or midline shift. No evidence of acute intracranial hemorrhage or infarct. Ventricular size is normal. Cerebellum unremarkable. Vascular: No asymmetric hyperdense vasculature at the skull base. Skull: Intact Sinuses/Orbits: Mild right and moderate left maxillary sinus mucosal thickening is noted. No air-fluid levels. Remaining paranasal sinuses are clear. Orbits are unremarkable. Other: Mastoid air cells and middle ear cavities are clear. IMPRESSION: Stable remote left basal ganglia/corona radiata infarct. No evidence of acute intracranial hemorrhage or infarct. Bilateral maxillary sinus disease, left greater than right. Electronically Signed  By: Helyn NumbersAshesh  Parikh MD   On: 08/10/2020 20:52       Meredeth IdeGagan S Edker Lang   [facility] If 7PM-7AM, please contact night-coverage at [email or web address], Office  [phone]   08/12/2020, 3:10 PM  LOS: 4 days

## 2020-08-12 NOTE — Progress Notes (Signed)
Inpatient Diabetes Program Recommendations  AACE/ADA: New Consensus Statement on Inpatient Glycemic Control   Target Ranges:  Prepandial:   less than 140 mg/dL      Peak postprandial:   less than 180 mg/dL (1-2 hours)      Critically ill patients:  140 - 180 mg/dL   Results for BURDETT, PINZON (MRN [id number]) as of 08/12/2020 12:01  Ref. Range 08/11/2020 06:22 08/11/2020 12:14 08/11/2020 16:38 08/11/2020 21:30 08/12/2020 06:15  Glucose-Capillary Latest Ref Range: 70 - 99 mg/dL 409 (H) 811 (H) 914 (H) 306 (H) 187 (H)   Review of Glycemic Control  Current orders for Inpatient glycemic control: Lantus 15 units QHS, Novolog 0-15 units TID with meals, Novolog 0-5 units QHS, Metformin 500 mg BID  Inpatient Diabetes Program Recommendations:    Insulin: Please consider ordering Novolog 3 units TID with meals for meal coverage if patient eats at least 50% of meals.  Thanks, Orlando Penner, RN, MSN, CDE Diabetes Coordinator Inpatient Diabetes Program [phone] (Team Pager from 8am to 5pm)

## 2020-08-12 NOTE — TOC Progression Note (Signed)
Transition of Care [facility]) - Progression Note    Patient Details  Name: Jeremy Lang MRN: [id number] Date of Birth: 1966/02/16  [facility] CM/SW Contact  Baldemar Len[place], [place] Phone Number: 08/12/2020, 4:36 PM  Clinical Narrative:   CSW received call from Joy with [employer] to review the patient's information for [employer] approval. CSW answered questions, and Ander Slade will complete the request and assign a number. CSW discussed bed offers with patient, he is agreeable to [facility]. CSW reached out to [place] to ask about auth status, and they are concerned that patient is having behaviors. CSW discussed that patient is not having behaviors, but they asked CSW to confirm with RN. CSW confirmed with RN that there are no behaviors and patient has been pleasant and cooperative.     Expected Discharge Plan: Skilled Nursing Facility Barriers to Discharge: Awaiting State Approval (PAS[employer]), Insurance Authorization  Expected Discharge Plan and Services Expected Discharge Plan: Skilled Nursing Facility     Post Acute Care Choice: Skilled Nursing Facility Living arrangements for the past 2 months: Apartment                                       Social Determinants of Health (SDOH) Interventions    Readmission Risk Interventions No flowsheet data found.

## 2020-08-13 LAB — GLUCOSE, CAPILLARY
Glucose-Capillary: 216 mg/dL — ABNORMAL HIGH (ref 70–99)
Glucose-Capillary: 257 mg/dL — ABNORMAL HIGH (ref 70–99)
Glucose-Capillary: 302 mg/dL — ABNORMAL HIGH (ref 70–99)
Glucose-Capillary: 165 mg/dL — ABNORMAL HIGH (ref 70–99)

## 2020-08-13 MED ORDER — INSULIN ASPART 100 UNIT/ML ~~LOC~~ SOLN
3.0000 [IU] | Freq: Three times a day (TID) | SUBCUTANEOUS | Status: DC
  Administered 2020-08-13 – 2020-08-14 (×2): 3 [IU] via SUBCUTANEOUS

## 2020-08-13 MED ORDER — INSULIN GLARGINE 100 UNIT/ML ~~LOC~~ SOLN
18.0000 [IU] | Freq: Every day | SUBCUTANEOUS | Status: DC
  Administered 2020-08-13: 18 [IU] via SUBCUTANEOUS
  Filled 2020-08-13 (×2): qty 0.18
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
## 2020-08-13 NOTE — Progress Notes (Signed)
[facility]  PROGRESS NOTE  Jeremy Lang ZOX:[id number] DOB: 07-15-1966 DOA: 08/07/2020 PCP: Jeremy Lang, No Pcp Per   Brief HPI:   54 year old male with history of diabetes mellitus type 2, CAD s/p CABG, seizures, prior hemorrhagic liver infarct, schizoaffective disorder came to ED for evaluation of acute right-sided weakness.  Jeremy Lang states that he had new onset right upper and lower extremity weakness with difficulty speaking.  CT head showed no acute infarct.  CTA head and neck was negative for large vessel occlusion.  Neurology was consulted, MRI brain and EEG were recommended.    Subjective   Jeremy Lang seen and examined, no new complaints. Awaiting to go to skilled nursing facility for rehab. Blood glucose is still elevated.   Assessment/Plan:     1. Right-sided weakness-Jeremy Lang's exam is not consistent, also documented by neurology that Jeremy Lang likely has psychogenic component.  MRI brain is negative for stroke.  EEG is negative.  Neurology has signed off. 2. Right diabetic foot ulcer-wound care was consulted, and recommended podiatry consultation.  Podiatry was consulted, they want to follow-up as outpatient.  He will need a better footwear to avoid possibility of infection and injury to limb loss. 3. Schizoaffective disorder-Jeremy Lang has schizoaffective disorder, currently on no medications.  He presented with psychogenic seizure/right-sided weakness.  Psychiatry was consulted, they did not feel that Jeremy Lang needs any acute therapy in the hospital.  Jeremy Lang can follow-up outpatient psychiatry for in-depth evaluation.  Jeremy Lang is cleared for discharge as per psychiatry.   4. CAD s/p CABG-Jeremy Lang reports prior CABG in 2009, currently on no medications. 5. Diabetes mellitus type 2-CBGs elevated, continue sensitive sliding scale insulin with NovoLog.  Hemoglobin A1c is 8.2.    Blood glucose is still elevated. Will increase the dose of Lantus to 18 units subcu daily.  Continue moderate  sliding scale insulin. Will add NovoLog 3 units 3 times daily meal coverage. 6. Seizure-like activity-Jeremy Lang had seizure-like activity in the ED while in the CT scanner.  He received IV Keppra and Ativan.  Neurology saw the Jeremy Lang and it seems that he had a psychogenic seizure.  No further antiepileptic medications recommended.  EEG is negative for epileptiform discharges. 7. Hyponatremia-likely pseudohyponatremia from hyperglycemia, follow BMP in am.   COVID-19 Labs  No results for input(s): DDIMER, FERRITIN, LDH, CRP in the last 72 hours.  Lab Results  Component Value Date   SARSCOV2NAA NEGATIVE 08/07/2020     Scheduled medications:   . insulin aspart  0-15 Units Subcutaneous TID WC  . insulin aspart  0-5 Units Subcutaneous QHS  . insulin aspart  3 Units Subcutaneous TID WC  . insulin glargine  18 Units Subcutaneous QHS  . lidocaine  1 patch Transdermal QHS  . metFORMIN  500 mg Oral BID WC  . mupirocin ointment   Topical BID  . polyethylene glycol  17 g Oral Daily  . senna-docusate  1 tablet Oral QHS         CBG: Recent Labs  Lab 08/12/20 1212 08/12/20 1653 08/12/20 2120 08/13/20 0605 08/13/20 1232  GLUCAP 156* 236* 295* 216* 257*    SpO2: 98 %    CBC: Recent Labs  Lab 08/07/20 1857 08/07/20 1858 08/12/20 1038  WBC 10.4  --  12.1*  NEUTROABS 6.6  --   --   HGB 15.4 16.0 15.2  HCT 47.0 47.0 44.5  MCV 86.9  --  84.9  PLT 202  --  158    Basic Metabolic Panel: Recent Labs  Lab 08/07/20 1857  08/07/20 1858 08/12/20 1038  NA 135 137 130*  K 3.8 3.7 4.3  CL 101 99 96*  CO2 25  --  25  GLUCOSE 72 68* 170*  BUN 13 15 16   CREATININE 1.06 1.00 1.02  CALCIUM 8.9  --  9.0     Liver Function Tests: Recent Labs  Lab 08/07/20 1857  AST 18  ALT 18  ALKPHOS 84  BILITOT 1.1  PROT 6.8  ALBUMIN 3.3*     Antibiotics: Anti-infectives (From admission, onward)   None       DVT prophylaxis: Lovenox  Code Status: Full code  Family  Communication: No family at bedside   Consultants:  Neurology  Procedures:      Objective   Vitals:   08/12/20 2305 08/13/20 0315 08/13/20 0844 08/13/20 1300  BP: (!) 163/107 (!) 144/81 118/61 120/71  Pulse: 60 66 64 68  Resp: 16 18 16 20   Temp: 98.2 F (36.8 C) 98.3 F (36.8 C) 97.9 F (36.6 C) 97.9 F (36.6 C)  TempSrc: Oral Oral Oral Oral  SpO2: 99% 98% 99% 98%  Weight:      Height:        Intake/Output Summary (Last 24 hours) at 08/13/2020 1436 Last data filed at 08/13/2020 1120 Gross per 24 hour  Intake 480 ml  Output 400 ml  Net 80 ml    11/14 1901 - 11/16 0700 In: 840 [P.O.:840] Out: 1100 [Urine:1100]  Filed Weights   08/07/20 1932  Weight: 106.6 kg    Physical Examination:   General-appears in no acute distress Heart-S1-S2, regular, no murmur auscultated Lungs-clear to auscultation bilaterally, no wheezing or crackles auscultated Abdomen-soft, nontender, no organomegaly Extremities-no edema in the lower extremities Neuro-alert, oriented x3, no focal deficit noted  Status is: Inpatient  Dispo: The Jeremy Lang is from: Home              Anticipated d/c is to: Skilled nursing facility              Anticipated d/c date is:  08/14/2020              Jeremy Lang currently  medically stable for discharge  Barrier to discharge-awaiting bed at skilled nursing facility      Data Reviewed:   Recent Results (from the past 240 hour(s))  Resp Panel by RT PCR (RSV, Flu A&B, Covid) - Nasopharyngeal Swab     Status: None   Collection Time: 08/07/20  7:43 PM   Specimen: Nasopharyngeal Swab  Result Value Ref Range Status   SARS Coronavirus 2 by RT PCR NEGATIVE NEGATIVE Final    Comment: (NOTE) SARS-CoV-2 target nucleic acids are NOT DETECTED.  The SARS-CoV-2 RNA is generally detectable in upper respiratoy specimens during the acute phase of infection. The lowest concentration of SARS-CoV-2 viral copies this assay can detect is 131 copies/mL. A negative  result does not preclude SARS-Cov-2 infection and should not be used as the sole basis for treatment or other Jeremy Lang management decisions. A negative result may occur with  improper specimen collection/handling, submission of specimen other than nasopharyngeal swab, presence of viral mutation(s) within the areas targeted by this assay, and inadequate number of viral copies (<131 copies/mL). A negative result must be combined with clinical observations, Jeremy Lang history, and epidemiological information. The expected result is Negative.  Fact Sheet for Patients:  08/16/2020  Fact Sheet for Healthcare Providers:  13/10/21  This test is no t yet approved or cleared by the https://www.moore.com/ and  has been authorized for detection and/or diagnosis of SARS-CoV-2 by FDA under an Emergency Use Authorization (EUA). This EUA will remain  in effect (meaning this test can be used) for the duration of the COVID-19 declaration under Section 564(b)(1) of the Act, 21 U.S.C. section 360bbb-3(b)(1), unless the authorization is terminated or revoked sooner.     Influenza A by PCR NEGATIVE NEGATIVE Final   Influenza B by PCR NEGATIVE NEGATIVE Final    Comment: (NOTE) The Xpert Xpress SARS-CoV-2/FLU/RSV assay is intended as an aid in  the diagnosis of influenza from Nasopharyngeal swab specimens and  should not be used as a sole basis for treatment. Nasal washings and  aspirates are unacceptable for Xpert Xpress SARS-CoV-2/FLU/RSV  testing.  Fact Sheet for Patients: https://www.moore.com/  Fact Sheet for Healthcare Providers: https://www.young.biz/  This test is not yet approved or cleared by the [place] FDA and  has been authorized for detection and/or diagnosis of SARS-CoV-2 by  FDA under an Emergency Use Authorization (EUA). This EUA will remain  in effect (meaning this test can be used)  for the duration of the  Covid-19 declaration under Section 564(b)(1) of the Act, 21  U.S.C. section 360bbb-3(b)(1), unless the authorization is  terminated or revoked.    Respiratory Syncytial Virus by PCR NEGATIVE NEGATIVE Final    Comment: (NOTE) Fact Sheet for Patients: https://www.moore.com/  Fact Sheet for Healthcare Providers: https://www.young.biz/  This test is not yet approved or cleared by the [place] FDA and  has been authorized for detection and/or diagnosis of SARS-CoV-2 by  FDA under an Emergency Use Authorization (EUA). This EUA will remain  in effect (meaning this test can be used) for the duration of the  COVID-19 declaration under Section 564(b)(1) of the Act, 21 U.S.C.  section 360bbb-3(b)(1), unless the authorization is terminated or  revoked. Performed at [facility], [address], [place], [place] [address]      Studies:  No results found.     Meredeth Ide   [facility] If 7PM-7AM, please contact night-coverage at [email or web address], Office  [phone]   08/13/2020, 2:36 PM  LOS: 5 days

## 2020-08-13 NOTE — Progress Notes (Signed)
Inpatient Diabetes Program Recommendations  AACE/ADA: New Consensus Statement on Inpatient Glycemic Control (2015)  Target Ranges:  Prepandial:   less than 140 mg/dL      Peak postprandial:   less than 180 mg/dL (1-2 hours)      Critically ill patients:  140 - 180 mg/dL   Lab Results  Component Value Date   GLUCAP 257 (H) 08/13/2020   HGBA1C 8.2 (H) 08/08/2020    Review of Glycemic Control Results for ROTH, RESS (MRN [id number]) as of 08/13/2020 13:05  Ref. Range 08/12/2020 06:15 08/12/2020 12:12 08/12/2020 16:53 08/12/2020 21:20 08/13/2020 06:05 08/13/2020 12:32  Glucose-Capillary Latest Ref Range: 70 - 99 mg/dL 409 (H) 811 (H) 914 (H) 295 (H) 216 (H) 257 (H)   Current orders for Inpatient glycemic control: Lantus 15 units QHS, Novolog 0-15 units TID with meals, Novolog 0-5 units QHS, Metformin 500 mg BID  Inpatient Diabetes Program Recommendations:    Might consider,  Novolog 3 units tid with meals if eats at least 50% of meal  Lantus 18 units QHS.  Will continue to follow while inpatient.  Thank you, Dulce Sellar, RN, BSN Diabetes Coordinator Inpatient Diabetes Program [phone] (team pager from 8a-5p)

## 2020-08-14 LAB — GLUCOSE, CAPILLARY
Glucose-Capillary: 281 mg/dL — ABNORMAL HIGH (ref 70–99)
Glucose-Capillary: 262 mg/dL — ABNORMAL HIGH (ref 70–99)
Glucose-Capillary: 195 mg/dL — ABNORMAL HIGH (ref 70–99)
Glucose-Capillary: 244 mg/dL — ABNORMAL HIGH (ref 70–99)
Glucose-Capillary: 227 mg/dL — ABNORMAL HIGH (ref 70–99)
Glucose-Capillary: 253 mg/dL — ABNORMAL HIGH (ref 70–99)

## 2020-08-14 MED ORDER — INSULIN ASPART 100 UNIT/ML ~~LOC~~ SOLN
5.0000 [IU] | Freq: Three times a day (TID) | SUBCUTANEOUS | Status: DC
  Administered 2020-08-14 (×2): 5 [IU] via SUBCUTANEOUS

## 2020-08-14 MED ORDER — INSULIN GLARGINE 100 UNIT/ML ~~LOC~~ SOLN
20.0000 [IU] | Freq: Every day | SUBCUTANEOUS | Status: DC
  Administered 2020-08-14: 20 [IU] via SUBCUTANEOUS
  Filled 2020-08-14 (×2): qty 0.2

## 2020-08-14 NOTE — Progress Notes (Signed)
Occupational Therapy Treatment Patient Details Name: Jeremy Lang MRN: [id number] DOB: 1966/09/24 Today's Date: 08/14/2020    History of present illness Pt is a 54 year old male who presented with impaired speech, R facial droop, and R sided weakness. He also displayed a seizure like episode. CT and MRI were negative for any acute abnormalities. NIHSS of 5-6. PMH significant for DM2, CAD, s/p CABG, seizures, prior hemorrhagic lacunar infarct, and schizoaffective disorder.   OT comments  Entered room with bed alarm going off with pt sitting edge of bed. Pt stated he needed to go to the bathroom. Pt began asking questions regarding his discharge and was asking about when he would be discharged and if he had plans finalized yet. Conversation was appropriate and pt was told that social work would be contacted to clear up any questions he had. Pt began to ambulate to the bathroom and paused after taking several steps. Pt began talking about his discharge plan again and talking to the social worker. In order to prevent fatigue and reduce risk of falls, I encouraged pt to walk into the bathroom and we could discuss it after he sat down. Pt became agitated and threw his RW, jerked himself away, began yelling and cussing and walked to the bathroom @ 10 ft. Attempted to de-escalate the situation by listening to the pt, however the pt was upset and was responding better to the nurse tech who was present. I removed myself from the pt's view and the nurse tech indicated that he was OK with the pt. After leaving the pt's view, he was talking more calmly to the nurse tech. Nursing and other staff came and asked if security needed to be called. Security was not called. SW notified of pt's concerns/questions regarding discharge. MD notified of situation. Will continue to follow acutely.   Follow Up Recommendations  SNF;Supervision/Assistance - 24 hour    Equipment Recommendations  None recommended by OT     Recommendations for Other Services Other (comment) (psych consult)    Precautions / Restrictions Precautions Precautions: Fall       Mobility Bed Mobility Overal bed mobility: Modified Independent             General bed mobility comments: sitting EOB on entry  Transfers Overall transfer level: Needs assistance   Transfers: Sit to/from Stand Sit to Stand: Supervision              Balance Overall balance assessment: Needs assistance   Sitting balance-Leahy Scale: Good       Standing balance-Leahy Scale: Fair                             ADL either performed or assessed with clinical judgement   ADL Overall ADL's : Needs assistance/impaired                         Toilet Transfer: Min guard;Ambulation;RW;Regular Toilet   Toileting- Architect and Hygiene: Supervision/safety       Functional mobility during ADLs: Min guard;Rolling walker;Cueing for safety General ADL Comments: Pt became agitated during mobility to bathroom, throwing RW across room and yelling/cussing at OT. After throwing RW, pt able to ambulate to toilet with S     Vision       Perception     Praxis      Cognition Arousal/Alertness: Awake/alert Behavior During Therapy: Impulsive;Agitated Overall Cognitive Status: No family/caregiver present  to determine baseline cognitive functioning Area of Impairment: Safety/judgement;Awareness                         Safety/Judgement: Decreased awareness of safety;Decreased awareness of deficits Awareness: Emergent Problem Solving: Slow processing          Exercises     Shoulder Instructions       General Comments      Pertinent Vitals/ Pain       Pain Assessment: Faces Faces Pain Scale: Hurts little more Pain Location: generalized Pain Descriptors / Indicators: Aching;Tightness;Sore Pain Intervention(s): Limited activity within patient's tolerance  Home Living                                           Prior Functioning/Environment              Frequency  Min 2X/week        Progress Toward Goals  OT Goals(current goals can now be found in the care plan section)  Progress towards OT goals: Not progressing toward goals - comment;OT to reassess next treatment (due to becoing agitated; goals need to be upgraded)  Acute Rehab OT Goals Patient Stated Goal: to talk to the social worker OT Goal Formulation: Patient unable to participate in goal setting Time For Goal Achievement: 08/25/20 Potential to Achieve Goals: Good ADL Goals Pt Will Perform Grooming: with min guard assist;standing Pt Will Perform Upper Body Bathing: with set-up;sitting Pt Will Perform Lower Body Bathing: with min guard assist;sit to/from stand Pt Will Perform Upper Body Dressing: with set-up;sitting Pt Will Perform Lower Body Dressing: with min guard assist;sit to/from stand Pt Will Transfer to Toilet: with min guard assist;ambulating;regular height toilet;bedside commode;grab bars Pt Will Perform Toileting - Clothing Manipulation and hygiene: with min guard assist;sit to/from stand Additional ADL Goal #1: Pt will independently use phone and locate needed ADL items  Plan Discharge plan remains appropriate    Co-evaluation                 AM-PAC OT "6 Clicks" Daily Activity     Outcome Measure   Help from another person eating meals?: None Help from another person taking care of personal grooming?: A Little Help from another person toileting, which includes using toliet, bedpan, or urinal?: A Little Help from another person bathing (including washing, rinsing, drying)?: A Little Help from another person to put on and taking off regular upper body clothing?: A Little Help from another person to put on and taking off regular lower body clothing?: A Little 6 Click Score: 19    End of Session Equipment Utilized During Treatment: Gait belt;Rolling  walker  OT Visit Diagnosis: Unsteadiness on feet (R26.81);Low vision, both eyes (H54.2);Pain;Other abnormalities of gait and mobility (R26.89) Pain - part of body:  (generalized)   Activity Tolerance Treatment limited secondary to agitation   Patient Left Other (comment) (in bathroom with NT)   Nurse Communication Other (comment);Mobility status (behavioral episode/safety zone)        Time: 1105-1120 OT Time Calculation (min): 15 min  Charges: OT General Charges $OT Visit: 1 Visit OT Treatments $Self Care/Home Management : 8-22 mins  Luisa Dago, OT/L   Acute OT Clinical Specialist Acute Rehabilitation Services Pager [phone] Office [phone]    [facility] 08/14/2020, 2:44 PM

## 2020-08-14 NOTE — Progress Notes (Signed)
Patient ID: Jeremy Lang, male   DOB: 1965/10/17, 54 y.o.   MRN: [id number]  PROGRESS NOTE    Jeremy Lang  RWE:[id number] DOB: 28-Jan-1966 DOA: 08/07/2020 PCP: Patient, No Pcp Per   Brief Narrative:  54 year old male with history of diabetes mellitus type 2, CAD status post CABG, seizures, prior hemorrhagic liver infarct, schizoaffective disorder presented with acute right-sided weakness.  CT of the head was negative for acute infarct.  CTA head and neck were negative for large vessel occlusion.  Neurology was consulted.  MRI of the brain and EEG were negative.  PT recommended SNF placement.  Assessment & Plan:   Right-sided weakness -patient's exam is not consistent, also documented by neurology that patient likely has psychogenic component.  MRI brain is negative for stroke.  EEG is negative.  Neurology has signed off.  Right diabetic foot ulcer -Wound care consult/podiatry consult appreciated.  Podiatry recommended outpatient follow-up.  He will need a better footwear to avoid the possibility of infection and injury to limb and limb loss.  Schizoaffective disorder -Currently not on any meds.  Presented with psychogenic seizures/right-sided weakness.   -Psychiatry was consulted: Did not feel that patient needs any acute therapy in the hospital.  Outpatient follow-up with psychiatry.  Patient is cleared for discharge as per psychiatric  CAD status post CABG -CABG in 2009.  Currently on no medications.  Outpatient follow-up  Diabetes mellitus type 2 uncontrolled with hyperglycemia -A1c 8.2.  Increase Lantus to 20 units nightly and NovoLog to 5 units 3 times a day with meals.  Continue Metformin.  Continue CBGs with SSI  Seizure like activity -patient had seizure-like activity in the ED while in the CT scanner.  He received IV Keppra and Ativan.  Neurology saw the patient and it seems that he had a psychogenic seizure.  No further antiepileptic medications recommended.  EEG is  negative for epileptiform discharges.    DVT prophylaxis: Lovenox Code Status: Full Family Communication: None at bedside Disposition Plan: Status is: Inpatient  Remains inpatient appropriate because:Inpatient level of care appropriate due to severity of illness   Dispo: The patient is from: SNF              Anticipated d/c is to: SNF              Anticipated d/c date is: 08/14/2020 if bed is available              Patient currently is medically stable to d/c.    Consultants: Neurology/psychiatry  Procedures: EEG  Antimicrobials: None   Subjective: Patient seen and examined at bedside.  Poor historian.  Does not participate in conversation was.  No overnight fever, vomiting reported.  Objective: Vitals:   08/13/20 2010 08/13/20 2316 08/14/20 0332 08/14/20 0723  BP: 104/66 108/66 110/69 127/60  Pulse: 76 79 73 60  Resp: 18 18 18 18   Temp: 98.6 F (37 C) 98.3 F (36.8 C) 98.1 F (36.7 C) (!) 97.5 F (36.4 C)  TempSrc: Oral Oral Oral Oral  SpO2: 98% 99% 98% 98%  Weight:      Height:        Intake/Output Summary (Last 24 hours) at 08/14/2020 1018 Last data filed at 08/13/2020 2316 Gross per 24 hour  Intake 360 ml  Output 450 ml  Net -90 ml   Filed Weights   08/07/20 1932  Weight: 106.6 kg    Examination:  General exam: Appears calm and comfortable.  Poor historian.  No obvious seizure-like activity  noted. Respiratory system: Bilateral decreased breath sounds at bases with some scattered crackles Cardiovascular system: S1 & S2 heard, Rate controlled Gastrointestinal system: Abdomen is nondistended, soft and nontender. Normal bowel sounds heard. Extremities: No cyanosis, clubbing; trace lower extremity edema   Data Reviewed: I have personally reviewed following labs and imaging studies  CBC: Recent Labs  Lab 08/07/20 1857 08/07/20 1858 08/12/20 1038  WBC 10.4  --  12.1*  NEUTROABS 6.6  --   --   HGB 15.4 16.0 15.2  HCT 47.0 47.0 44.5  MCV 86.9   --  84.9  PLT 202  --  158   Basic Metabolic Panel: Recent Labs  Lab 08/07/20 1857 08/07/20 1858 08/12/20 1038  NA 135 137 130*  K 3.8 3.7 4.3  CL 101 99 96*  CO2 25  --  25  GLUCOSE 72 68* 170*  BUN 13 15 16   CREATININE 1.06 1.00 1.02  CALCIUM 8.9  --  9.0   GFR: Estimated Creatinine Clearance: 96.4 mL/min (by C-G formula based on SCr of 1.02 mg/dL). Liver Function Tests: Recent Labs  Lab 08/07/20 1857  AST 18  ALT 18  ALKPHOS 84  BILITOT 1.1  PROT 6.8  ALBUMIN 3.3*   No results for input(s): LIPASE, AMYLASE in the last 168 hours. No results for input(s): AMMONIA in the last 168 hours. Coagulation Profile: Recent Labs  Lab 08/07/20 1857  INR 0.9   Cardiac Enzymes: No results for input(s): CKTOTAL, CKMB, CKMBINDEX, TROPONINI in the last 168 hours. BNP (last 3 results) No results for input(s): PROBNP in the last 8760 hours. HbA1C: No results for input(s): HGBA1C in the last 72 hours. CBG: Recent Labs  Lab 08/13/20 1232 08/13/20 1627 08/13/20 2111 08/14/20 0610 08/14/20 0729  GLUCAP 257* 302* 165* 281* 262*   Lipid Profile: No results for input(s): CHOL, HDL, LDLCALC, TRIG, CHOLHDL, LDLDIRECT in the last 72 hours. Thyroid Function Tests: No results for input(s): TSH, T4TOTAL, FREET4, T3FREE, THYROIDAB in the last 72 hours. Anemia Panel: No results for input(s): VITAMINB12, FOLATE, FERRITIN, TIBC, IRON, RETICCTPCT in the last 72 hours. Sepsis Labs: No results for input(s): PROCALCITON, LATICACIDVEN in the last 168 hours.  Recent Results (from the past 240 hour(s))  Resp Panel by RT PCR (RSV, Flu A&B, Covid) - Nasopharyngeal Swab     Status: None   Collection Time: 08/07/20  7:43 PM   Specimen: Nasopharyngeal Swab  Result Value Ref Range Status   SARS Coronavirus 2 by RT PCR NEGATIVE NEGATIVE Final    Comment: (NOTE) SARS-CoV-2 target nucleic acids are NOT DETECTED.  The SARS-CoV-2 RNA is generally detectable in upper respiratoy specimens during  the acute phase of infection. The lowest concentration of SARS-CoV-2 viral copies this assay can detect is 131 copies/mL. A negative result does not preclude SARS-Cov-2 infection and should not be used as the sole basis for treatment or other patient management decisions. A negative result may occur with  improper specimen collection/handling, submission of specimen other than nasopharyngeal swab, presence of viral mutation(s) within the areas targeted by this assay, and inadequate number of viral copies (<131 copies/mL). A negative result must be combined with clinical observations, patient history, and epidemiological information. The expected result is Negative.  Fact Sheet for Patients:  13/10/21  Fact Sheet for Healthcare Providers:  https://www.moore.com/  This test is no t yet approved or cleared by the https://www.young.biz/ FDA and  has been authorized for detection and/or diagnosis of SARS-CoV-2 by FDA under an Emergency  Use Authorization (EUA). This EUA will remain  in effect (meaning this test can be used) for the duration of the COVID-19 declaration under Section 564(b)(1) of the Act, 21 U.S.C. section 360bbb-3(b)(1), unless the authorization is terminated or revoked sooner.     Influenza A by PCR NEGATIVE NEGATIVE Final   Influenza B by PCR NEGATIVE NEGATIVE Final    Comment: (NOTE) The Xpert Xpress SARS-CoV-2/FLU/RSV assay is intended as an aid in  the diagnosis of influenza from Nasopharyngeal swab specimens and  should not be used as a sole basis for treatment. Nasal washings and  aspirates are unacceptable for Xpert Xpress SARS-CoV-2/FLU/RSV  testing.  Fact Sheet for Patients: https://www.moore.com/  Fact Sheet for Healthcare Providers: https://www.young.biz/  This test is not yet approved or cleared by the [place] FDA and  has been authorized for detection and/or  diagnosis of SARS-CoV-2 by  FDA under an Emergency Use Authorization (EUA). This EUA will remain  in effect (meaning this test can be used) for the duration of the  Covid-19 declaration under Section 564(b)(1) of the Act, 21  U.S.C. section 360bbb-3(b)(1), unless the authorization is  terminated or revoked.    Respiratory Syncytial Virus by PCR NEGATIVE NEGATIVE Final    Comment: (NOTE) Fact Sheet for Patients: https://www.moore.com/  Fact Sheet for Healthcare Providers: https://www.young.biz/  This test is not yet approved or cleared by the [place] FDA and  has been authorized for detection and/or diagnosis of SARS-CoV-2 by  FDA under an Emergency Use Authorization (EUA). This EUA will remain  in effect (meaning this test can be used) for the duration of the  COVID-19 declaration under Section 564(b)(1) of the Act, 21 U.S.C.  section 360bbb-3(b)(1), unless the authorization is terminated or  revoked. Performed at [facility], [address], [place], [place] [address]          Radiology Studies: No results found.      Scheduled Meds: . insulin aspart  0-15 Units Subcutaneous TID WC  . insulin aspart  0-5 Units Subcutaneous QHS  . insulin aspart  3 Units Subcutaneous TID WC  . insulin glargine  18 Units Subcutaneous QHS  . lidocaine  1 patch Transdermal QHS  . metFORMIN  500 mg Oral BID WC  . mupirocin ointment   Topical BID  . polyethylene glycol  17 g Oral Daily  . senna-docusate  1 tablet Oral QHS   Continuous Infusions:        Glade Lloyd, MD [facility] 08/14/2020, 10:18 AM

## 2020-08-14 NOTE — TOC Progression Note (Signed)
Transition of Care [facility]) - Progression Note    Patient Details  Name: Jeremy Lang MRN: [id number] Date of Birth: [birth year]  Transition of Care [facility]) CM/SW Contact  Baldemar Lenis, [place] Phone Number: 08/14/2020, 2:21 PM  Clinical Narrative:   CSW alerted by OT that patient was asking for update. CSW reached out to [facility] again to confirm that they started insurance, and insurance is still pending. Bed is available tomorrow if the insurance is approved. CSW attempted to contact the patient to provide update, left a voicemail for him indicating that his insurance is still pending, but hopeful for tomorrow. CSW to follow.    Expected Discharge Plan: Skilled Nursing Facility Barriers to Discharge: Insurance Authorization  Expected Discharge Plan and Services Expected Discharge Plan: Skilled Nursing Facility     Post Acute Care Choice: Skilled Nursing Facility Living arrangements for the past 2 months: Apartment Expected Discharge Date: 08/14/20                                     Social Determinants of Health (SDOH) Interventions    Readmission Risk Interventions No flowsheet data found.

## 2020-08-14 NOTE — Progress Notes (Signed)
PT Cancellation Note  Patient Details Name: Jeremy Lang MRN: [id number] DOB: October 22, 1965   Cancelled Treatment:    Reason Eval/Treat Not Completed: Other (comment) Pt politely declined PT due to not feeling well.  Noted pt has been agitated with violent tendencies today , so did not push therapy.  Will f/u as able.  Anise Salvo, PT Acute Rehab Services Pager [phone] [facility] [phone]    Rayetta Humphrey 08/14/2020, 4:59 PM

## 2020-08-14 NOTE — Progress Notes (Signed)
Patient is becoming increasingly upset/agitated with his current diet order (carb modified), repeatedly calling the kitchen requesting additional meals. MD notified and is okay with placing regular diet order at this time.

## 2020-08-14 NOTE — Plan of Care (Signed)
  Problem: Health Behavior/Discharge Planning: Goal: Ability to manage health-related needs will improve Outcome: Progressing   Problem: Education: Goal: Knowledge of General Education information will improve Description: Including pain rating scale, medication(s)/side effects and non-pharmacologic comfort measures Outcome: Progressing   Problem: Clinical Measurements: Goal: Ability to maintain clinical measurements within normal limits will improve Outcome: Progressing Goal: Will remain free from infection Outcome: Progressing Goal: Diagnostic test results will improve Outcome: Progressing Goal: Respiratory complications will improve Outcome: Progressing Goal: Cardiovascular complication will be avoided Outcome: Progressing   Problem: Activity: Goal: Risk for activity intolerance will decrease Outcome: Progressing   Problem: Nutrition: Goal: Adequate nutrition will be maintained Outcome: Progressing   Problem: Coping: Goal: Level of anxiety will decrease Outcome: Progressing   Problem: Elimination: Goal: Will not experience complications related to bowel motility Outcome: Progressing Goal: Will not experience complications related to urinary retention Outcome: Progressing   Problem: Pain Managment: Goal: General experience of comfort will improve Outcome: Progressing   Problem: Safety: Goal: Ability to remain free from injury will improve Outcome: Progressing   Problem: Skin Integrity: Goal: Risk for impaired skin integrity will decrease Outcome: Progressing   Problem: Coping: Goal: Will verbalize positive feelings about self Outcome: Progressing Goal: Will identify appropriate support needs Outcome: Progressing   Problem: Health Behavior/Discharge Planning: Goal: Ability to manage health-related needs will improve Outcome: Progressing   Problem: Intracerebral Hemorrhage Tissue Perfusion: Goal: Complications of Intracerebral Hemorrhage will be  minimized Outcome: Progressing   Problem: Ischemic Stroke/TIA Tissue Perfusion: Goal: Complications of ischemic stroke/TIA will be minimized Outcome: Progressing

## 2020-08-15 LAB — BASIC METABOLIC PANEL
Anion gap: 8 (ref 5–15)
BUN: 26 mg/dL — ABNORMAL HIGH (ref 6–20)
CO2: 27 mmol/L (ref 22–32)
Calcium: 9 mg/dL (ref 8.9–10.3)
Chloride: 99 mmol/L (ref 98–111)
Creatinine, Ser: 1.35 mg/dL — ABNORMAL HIGH (ref 0.61–1.24)
GFR, Estimated: >60 mL/min (ref >60)
Glucose, Bld: 233 mg/dL — ABNORMAL HIGH (ref 70–99)
Potassium: 4.4 mmol/L (ref 3.5–5.1)
Sodium: 134 mmol/L — ABNORMAL LOW (ref 135–145)

## 2020-08-15 LAB — CBC WITH DIFFERENTIAL/PLATELET
Abs Immature Granulocytes: 0.03 10*3/uL (ref 0.00–0.07)
Basophils Absolute: 0.1 10*3/uL (ref 0.0–0.1)
Basophils Relative: 1 %
Eosinophils Absolute: 0.1 10*3/uL (ref 0.0–0.5)
Eosinophils Relative: 2 %
HCT: 39.8 % (ref 39.0–52.0)
Hemoglobin: 13.3 g/dL (ref 13.0–17.0)
Immature Granulocytes: 0 %
Lymphocytes Relative: 31 %
Lymphs Abs: 2.4 10*3/uL (ref 0.7–4.0)
MCH: 28.7 pg (ref 26.0–34.0)
MCHC: 33.4 g/dL (ref 30.0–36.0)
MCV: 85.8 fL (ref 80.0–100.0)
Monocytes Absolute: 0.7 10*3/uL (ref 0.1–1.0)
Monocytes Relative: 9 %
Neutro Abs: 4.4 10*3/uL (ref 1.7–7.7)
Neutrophils Relative %: 57 %
Platelets: 175 10*3/uL (ref 150–400)
RBC: 4.64 MIL/uL (ref 4.22–5.81)
RDW: 13.5 % (ref 11.5–15.5)
WBC: 7.8 10*3/uL (ref 4.0–10.5)
nRBC: 0 % (ref 0.0–0.2)

## 2020-08-15 LAB — GLUCOSE, CAPILLARY
Glucose-Capillary: 258 mg/dL — ABNORMAL HIGH (ref 70–99)
Glucose-Capillary: 215 mg/dL — ABNORMAL HIGH (ref 70–99)
Glucose-Capillary: 203 mg/dL — ABNORMAL HIGH (ref 70–99)
Glucose-Capillary: 235 mg/dL — ABNORMAL HIGH (ref 70–99)

## 2020-08-15 LAB — LEVETIRACETAM LEVEL: Levetiracetam Lvl: <1 ug/mL — ABNORMAL LOW (ref 10.0–40.0)

## 2020-08-15 LAB — SARS CORONAVIRUS 2 BY RT PCR (HOSPITAL ORDER, PERFORMED IN ~~LOC~~ HOSPITAL LAB): SARS Coronavirus 2: NEGATIVE

## 2020-08-15 LAB — MAGNESIUM: Magnesium: 1.5 mg/dL — ABNORMAL LOW (ref 1.7–2.4)

## 2020-08-15 MED ORDER — OFLOXACIN 0.3 % OP SOLN: 1.0000 [drp] | Freq: Four times a day (QID) | OPHTHALMIC | 0 refills | Status: AC

## 2020-08-15 MED ORDER — INSULIN ASPART 100 UNIT/ML ~~LOC~~ SOLN
7.0000 [IU] | Freq: Three times a day (TID) | SUBCUTANEOUS | Status: DC
  Administered 2020-08-15 – 2020-08-16 (×4): 7 [IU] via SUBCUTANEOUS

## 2020-08-15 MED ORDER — OFLOXACIN 0.3 % OP SOLN
1.0000 [drp] | Freq: Four times a day (QID) | OPHTHALMIC | Status: DC
  Administered 2020-08-15 – 2020-08-16 (×5): 1 [drp] via OPHTHALMIC
  Filled 2020-08-15: qty 5

## 2020-08-15 MED ORDER — NOVOLOG FLEXPEN 100 UNIT/ML ~~LOC~~ SOPN: 7.0000 [IU] | PEN_INJECTOR | Freq: Three times a day (TID) | SUBCUTANEOUS | Status: AC

## 2020-08-15 MED ORDER — INSULIN GLARGINE 100 UNIT/ML ~~LOC~~ SOLN
25.0000 [IU] | Freq: Every day | SUBCUTANEOUS | Status: DC
  Administered 2020-08-15: 25 [IU] via SUBCUTANEOUS
  Filled 2020-08-15 (×2): qty 0.25

## 2020-08-15 MED ORDER — MAGNESIUM OXIDE 400 (241.3 MG) MG PO TABS
800.0000 mg | ORAL_TABLET | Freq: Once | ORAL | Status: AC
  Administered 2020-08-15: 800 mg via ORAL
  Filled 2020-08-15: qty 2

## 2020-08-15 NOTE — Progress Notes (Signed)
Strong odor of cigarette smoke coming from patient room. Patient admitted to smoking only one. Patient was told there is a no smoking policy at [facility] he said he would not do it again. Son visited earlier this evening and brought a black duffle bag with 3 packs of cigarettes in it. Also patient was observed trying to hide 2 extra large chocolate bars under pillow and was told not to eat them because it would be difficult to manage his blood sugar level.

## 2020-08-15 NOTE — Discharge Summary (Addendum)
Physician Discharge Summary  Jeremy Lang RFX:[id number] DOB: Nov 27, 1965 DOA: 08/07/2020  PCP: Patient, No Pcp Per  Admit date: 08/07/2020 Discharge date: 08/16/2020  Admitted From: Home Disposition: SNF  Recommendations for Outpatient Follow-up:  1. Follow up with SNF provider at earliest convenience 2. Outpatient follow-up with psychiatry and neurology 3. Comply with medications, follow-up and diet  4. follow up in ED if symptoms worsen or new appear   Home Health: No Equipment/Devices: None  Discharge Condition: Stable CODE STATUS: Full Diet recommendation: Heart healthy/carb modified  Brief/Interim Summary: 54 year old male with history of diabetes mellitus type 2, CAD status post CABG, seizures, prior hemorrhagic liver infarct, schizoaffective disorder presented with acute right-sided weakness.  CT of the head was negative for acute infarct.  CTA head and neck were negative for large vessel occlusion.  Neurology was consulted.  MRI of the brain and EEG were negative.  PT recommended SNF placement.  Discharge to SNF once bed is available.  Addendum 10/09/1919 Patient is waiting to be discharged to SNF. He is currently medically stable for discharge.  He will be discharged to SNF once bed is available.   Discharge Diagnoses:   Right-sided weakness -patient's exam is not consistent, also documented by neurology that patient likely has psychogenic component. MRI brain is negative for stroke. EEG is negative. Neurology has signed off. -Outpatient follow-up with neurology if needed. -Discharge to SNF once bed is available.  Right diabetic foot ulcer -Wound care consult/podiatry consult appreciated.  Podiatry recommended outpatient follow-up.  He will need a better footwear to avoid the possibility of infection and injury to limb and limb loss.  Schizoaffective disorder -Currently not on any meds.  Presented with psychogenic seizures/right-sided weakness.   -Psychiatry  was consulted: Did not feel that patient needs any acute therapy in the hospital.  Outpatient follow-up with psychiatry.  Patient is cleared for discharge as per psychiatric  Right eye conjunctivitis -Right eye looks red.  Will start ofloxacin drops for 7 days.  CAD status post CABG -CABG in 2009.  Currently on no medications.  Outpatient follow-up  Diabetes mellitus type 2 uncontrolled with hyperglycemia -A1c 8.2.    Continue Levemir twice a day and NovoLog 7 units 3 times a day with meals upon discharge.  Carb modified diet.  Patient refusing to follow carb modified diet in the hospital despite counseling.  Seizure like activity -patient had seizure-like activity in the ED while in the CT scanner. He received IV Keppra and Ativan. Neurology saw the patient and it seems that he had a psychogenic seizure. No further antiepileptic medications recommended. EEG is negative for epileptiform discharges.   Discharge Instructions  Discharge Instructions    Ambulatory referral to Neurology   Complete by: As directed    An appointment is requested in approximately: In a few weeks for hospital follow-up of altered mental status/?  Psychogenic seizure   Ambulatory referral to Psychiatry   Complete by: As directed    Ambulatory referral to Wound Clinic   Complete by: As directed    Follow-up of chronic lower extremity wounds   Diet - low sodium heart healthy   Complete by: As directed    Diet Carb Modified   Complete by: As directed    Discharge wound care:   Complete by: As directed    Wound care to chronic foot wounds on great toe and lateral aspect of foot/5th digit. Wash feet with soap and water, rinse and pat dry. Apply mupirocin to both areas and cover  with foam dressing. Change twice daily.   Increase activity slowly   Complete by: As directed      Allergies as of 08/15/2020   No Known Allergies     Medication List    STOP taking these medications   metoCLOPramide 10 MG  tablet Commonly known as: REGLAN     TAKE these medications   cholecalciferol 25 MCG (1000 UNIT) tablet Commonly known as: VITAMIN D3 Take 1,000 Units by mouth daily.   Levemir FlexTouch 100 UNIT/ML FlexPen Generic drug: insulin detemir Inject 20 Units into the skin in the morning and at bedtime.   NovoLOG FlexPen 100 UNIT/ML FlexPen Generic drug: insulin aspart Inject 7 Units into the skin 3 (three) times daily with meals. What changed:   how much to take  when to take this   ofloxacin 0.3 % ophthalmic solution Commonly known as: OCUFLOX Place 1 drop into the right eye 4 (four) times daily. For 7 days   vitamin C 250 MG tablet Commonly known as: ASCORBIC ACID Take 250 mg by mouth daily.            Discharge Care Instructions  (From admission, onward)         Start     Ordered   08/15/20 0000  Discharge wound care:       Comments: Wound care to chronic foot wounds on great toe and lateral aspect of foot/5th digit. Wash feet with soap and water, rinse and pat dry. Apply mupirocin to both areas and cover with foam dressing. Change twice daily.   08/15/20 1056          Follow-up Information    PCP. Schedule an appointment as soon as possible for a visit in 1 week(s).              No Known Allergies  Consultations:  Neurology/psychiatry/Podiatry   Procedures/Studies: EEG  Result Date: 08/08/2020 Charlsie Quest, MD     08/08/2020 11:11 AM Patient Name: Jeremy Lang MRN: [id number] Epilepsy Attending: Charlsie Quest Referring Physician/Provider: Dr Erick Blinks Date: 08/07/2020 Duration: 23. 48 minutes Patient history: 54 year old male with history of epilepsy who presented with acute onset right-sided weakness with intermittent aphasia.  EEG to evaluate for seizures. Level of alertness: Awake AEDs during EEG study: Keppra, Ativan Technical aspects: This EEG study was done with scalp electrodes positioned according to the 10-20 International  system of electrode placement. Electrical activity was acquired at a sampling rate of 500Hz  and reviewed with a high frequency filter of 70Hz  and a low frequency filter of 1Hz . EEG data were recorded continuously and digitally stored. Description: No clear posterior dominant rhythm was seen.  There is an excessive amount of 15 to 18 Hz beta activity distributed symmetrically and diffusely.  Hyperventilation and photic stimulation were not performed.   ABNORMALITY -Excessive beta, generalized IMPRESSION: This study is within normal limits. The excessive beta activity seen in the background is most likely due to the effect of benzodiazepine and is a benign EEG pattern. No seizures or epileptiform discharges were seen throughout the recording. Charlsie Quest   DG Chest 2 View  Result Date: 07/16/2020 CLINICAL DATA:  Chest pain EXAM: CHEST - 2 VIEW COMPARISON:  None. FINDINGS: Lungs are clear. No pneumothorax or pleural effusion. Coronary artery bypass grafting has been performed. Cardiac size within normal limits. Pulmonary vascularity is normal. No acute bone abnormality. IMPRESSION: No active cardiopulmonary disease. Electronically Signed   By: Helyn Numbers MD  On: 07/16/2020 14:20   DG Elbow 2 Views Right  Result Date: 08/08/2020 CLINICAL DATA:  Pain status post fall EXAM: RIGHT ELBOW - 2 VIEW COMPARISON:  None. FINDINGS: There is no evidence of fracture, dislocation, or joint effusion. There is no evidence of arthropathy or other focal bone abnormality. Soft tissues are unremarkable. IMPRESSION: Negative. Electronically Signed   By: Katherine Mantle M.D.   On: 08/08/2020 21:53   CT HEAD WO CONTRAST  Result Date: 08/10/2020 CLINICAL DATA:  Right upper and lower extremity weakness, dysarthria EXAM: CT HEAD WITHOUT CONTRAST TECHNIQUE: Contiguous axial images were obtained from the base of the skull through the vertex without intravenous contrast. COMPARISON:  08/08/2020, 08/07/2020 FINDINGS:  Brain: Normal anatomic configuration. Parenchymal volume loss is commensurate with the patient's age. Mild periventricular white matter changes are present likely reflecting the sequela of small vessel ischemia. Stable remote infarct of the left putamen and corona radiata. No abnormal intra or extra-axial mass lesion or fluid collection. No abnormal mass effect or midline shift. No evidence of acute intracranial hemorrhage or infarct. Ventricular size is normal. Cerebellum unremarkable. Vascular: No asymmetric hyperdense vasculature at the skull base. Skull: Intact Sinuses/Orbits: Mild right and moderate left maxillary sinus mucosal thickening is noted. No air-fluid levels. Remaining paranasal sinuses are clear. Orbits are unremarkable. Other: Mastoid air cells and middle ear cavities are clear. IMPRESSION: Stable remote left basal ganglia/corona radiata infarct. No evidence of acute intracranial hemorrhage or infarct. Bilateral maxillary sinus disease, left greater than right. Electronically Signed   By: Helyn Numbers MD   On: 08/10/2020 20:52   CT HEAD WO CONTRAST  Result Date: 08/08/2020 CLINICAL DATA:  Larey Seat off toilet, hit head EXAM: CT HEAD WITHOUT CONTRAST TECHNIQUE: Contiguous axial images were obtained from the base of the skull through the vertex without intravenous contrast. COMPARISON:  08/07/2020, 08/08/2020 FINDINGS: Brain: Stable chronic ischemic changes within the periventricular white matter. No acute infarct or hemorrhage. Lateral ventricles and midline structures are unremarkable. No acute extra-axial fluid collections. No mass effect. Vascular: No hyperdense vessel or unexpected calcification. Skull: Normal. Negative for fracture or focal lesion. Sinuses/Orbits: Stable mucosal thickening within the maxillary sinuses. Other: None. IMPRESSION: 1. Stable chronic small vessel ischemic changes. No acute intracranial process. Electronically Signed   By: Sharlet Salina M.D.   On: 08/08/2020 22:21    MR BRAIN WO CONTRAST  Result Date: 08/08/2020 CLINICAL DATA:  Acute right-sided weakness EXAM: MRI HEAD WITHOUT CONTRAST TECHNIQUE: Multiplanar, multiecho pulse sequences of the brain and surrounding structures were obtained without intravenous contrast. COMPARISON:  None. FINDINGS: Brain: No acute infarct, acute hemorrhage or extra-axial collection. Old left corona radiata small vessel infarct. There is multifocal periventricular white matter hyperintensity, most often a result of chronic microvascular ischemia. There is generalized atrophy without lobar predilection. No chronic microhemorrhage. Normal midline structures. Vascular: Normal flow voids. Skull and upper cervical spine: Normal marrow signal. Sinuses/Orbits: Bilateral maxillary sinus mucosal thickening. Normal orbits. Other: None. IMPRESSION: 1. No acute intracranial abnormality. 2. Old left corona radiata small vessel infarct. Electronically Signed   By: Deatra Robinson M.D.   On: 08/08/2020 00:55   CT Abdomen Pelvis W Contrast  Result Date: 07/16/2020 CLINICAL DATA:  Abdominal pain with nausea for 6 days EXAM: CT ABDOMEN AND PELVIS WITH CONTRAST TECHNIQUE: Multidetector CT imaging of the abdomen and pelvis was performed using the standard protocol following bolus administration of intravenous contrast. CONTRAST:  OMNIPAQUE IOHEXOL 300 MG/ML  SOLN COMPARISON:  None. FINDINGS: Lower chest: No  acute abnormality. Hepatobiliary: No focal liver abnormality is seen. Tiny stone is seen within the dependent gallbladder near the level of the gallbladder neck. No gallbladder wall thickening or pericholecystic inflammatory changes by CT. No biliary dilatation. Pancreas: Unremarkable. No pancreatic ductal dilatation or surrounding inflammatory changes. Spleen: Normal in size without focal abnormality. Adrenals/Urinary Tract: Unremarkable adrenal glands. Horseshoe configuration of the kidneys. Symmetric renal enhancement without focal lesion, stone,  or hydronephrosis. Bilateral ureters are unremarkable. There is diffuse urinary bladder wall thickening slightly asymmetrically involving the anterior wall and bladder dome. Stomach/Bowel: Small sliding type hiatal hernia. Stomach otherwise within normal limits. No dilated loops of bowel to suggest obstruction. No focal bowel wall thickening or inflammatory changes. Vascular/Lymphatic: Scattered aortoiliac atherosclerotic calcifications without aneurysm. There are a few mildly prominent right external iliac chain lymph nodes, the largest measuring 10 mm short axis (series 2, image 78). Reproductive: Prostate is unremarkable. Other: No free fluid. No abdominopelvic fluid collection. No pneumoperitoneum. There is a ventral abdominal wall fat containing hernia. Evidence of prior midline ventral abdominal wall surgery. Musculoskeletal: No acute or significant osseous findings. IMPRESSION: 1. Diffuse urinary bladder wall thickening slightly asymmetrically involving the anterior wall and bladder dome. Findings may represent cystitis. Correlation with urinalysis is recommended. Given the asymmetry, direct visualization should be considered to exclude an underlying neoplasm. 2. Mildly prominent right external iliac chain lymph nodes, nonspecific and may be reactive. 3. Cholelithiasis without evidence of acute cholecystitis. 4. Small sliding type hiatal hernia. 5. Fat containing ventral abdominal wall hernia. 6. Horseshoe configuration of the kidneys without obstructive uropathy. 7. Aortic atherosclerosis. (ICD10-I70.0). Electronically Signed   By: Duanne Guess D.O.   On: 07/16/2020 15:53   EEG adult  Result Date: 08/10/2020 Rejeana Brock, MD     08/10/2020  1:11 PM Patient Name: Jeremy Lang MRN: [id number] EEG Attending: Ritta Slot Referring Physician/Provider: Uvaldo Bristle Date: 08/10/2020 Duration: 27 minutes Patient history: 54 year old male being evaluated for right-sided weakness Level of  alertness: Awake AEDs during EEG study: None Technical aspects: This EEG study was done with scalp electrodes positioned according to the 10-20 International system of electrode placement. Electrical activity was acquired at a sampling rate of 500Hz  and reviewed with a high frequency filter of 70Hz  and a low frequency filter of 1Hz . EEG data were recorded continuously and digitally stored. BACKGROUND ACTIVITY: Posterior dominant rhythm: The posterior dominant rhythm consists of 11-12 Hz activity of 10 - 15uV seen predominantly in posterior head regions, symmetric and reactive to eye opening and eye closing.        Slowing: None EPILEPTIFORM ACTIVITY: Interictal epileptiform activity: None Ictal Activity: None OTHER EVENTS: None SLEEP RECORDINGS: Only awake and drowsy states were recorded. Drowsiness was characterized by anterior shifting of the posterior background rhythm. ACTIVATION PROCEDURES: Hyperventilation - mild slowing photic stimulation -physiological driving is present IMPRESSION: This study is within normal limits. There was no seizure or evidence of seizure predisposition recorded on this study. Please note that lack of epileptiform activity on EEG does not preclude the possibility of epilepsy.  Ritta Slot, MD [facility] [phone] If 7pm- 7am, please page neurology on call as listed in AMION.   DG HIP UNILAT WITH PELVIS 2-3 VIEWS RIGHT  Result Date: 08/08/2020 CLINICAL DATA:  Hip pain EXAM: DG HIP (WITH OR WITHOUT PELVIS) 2-3V RIGHT COMPARISON:  None. FINDINGS: There is mild bilateral hip osteoarthritis. There is no acute displaced fracture or dislocation. There are multiple calcifications projecting over the patient's pelvis that are favored to  represent phleboliths. IMPRESSION: Mild bilateral hip osteoarthritis. No acute displaced fracture or dislocation. Electronically Signed   By: Katherine Mantle M.D.   On: 08/08/2020 21:54   CT HEAD CODE STROKE WO  CONTRAST  Result Date: 08/07/2020 CLINICAL DATA:  Code stroke. 54 year old male with right side weakness. EXAM: CT HEAD WITHOUT CONTRAST TECHNIQUE: Contiguous axial images were obtained from the base of the skull through the vertex without intravenous contrast. COMPARISON:  Head CT 04/23/2020.  Brain MRI 04/24/2020. FINDINGS: Brain: Chronic lacunar infarct of the left corona radiata and lentiform appears stable since July. Stable gray-white matter differentiation elsewhere. Stable chronic encephalomalacia in the left amygdala. No midline shift, ventriculomegaly, mass effect, evidence of mass lesion, intracranial hemorrhage or evidence of cortically based acute infarction. Vascular: Calcified atherosclerosis at the skull base. No suspicious intracranial vascular hyperdensity. Skull: No acute osseous abnormality identified. Sinuses/Orbits: Increased fluid in the left maxillary sinus. Increased right maxillary mucosal thickening. Other visualized paranasal sinuses and mastoids are stable and well pneumatized. Other: No acute orbit or scalp soft tissue finding. ASPECTS [facility] Stroke Program Early CT Score) Total score (0-10 with 10 being normal): 10 (chronic encephalomalacia in the left hemisphere). IMPRESSION: 1. Stable non contrast CT appearance of the brain since July, with chronic small vessel disease in the left corona radiata and chronic left amygdala encephalomalacia. 2. No acute cortically based infarct or acute intracranial hemorrhage identified. ASPECTS 10. 3. These results were communicated to Dr. Derry Lory at 7:08 pmon 11/10/2021by text page via the [facility] messaging system. Electronically Signed   By: Odessa Fleming M.D.   On: 08/07/2020 19:09   CT ANGIO HEAD CODE STROKE  Result Date: 08/07/2020 CLINICAL DATA:  54 year old male code stroke presentation. Right side weakness. EXAM: CT ANGIOGRAPHY HEAD AND NECK TECHNIQUE: Multidetector CT imaging of the head and neck was performed using the standard protocol  during bolus administration of intravenous contrast. Multiplanar CT image reconstructions and MIPs were obtained to evaluate the vascular anatomy. Carotid stenosis measurements (when applicable) are obtained utilizing NASCET criteria, using the distal internal carotid diameter as the denominator. CONTRAST:  75mL OMNIPAQUE IOHEXOL 350 MG/ML SOLN COMPARISON:  Plain head CT 1858 hours today. Brain MRI and intracranial MRA 04/24/2020. FINDINGS: CTA NECK Skeleton: Prior sternotomy. Absent dentition. No acute osseous abnormality identified. Bilateral maxillary sinus disease again noted. Upper chest: Calcified coronary artery atherosclerosis. Visible central pulmonary arteries appear patent. Negative upper lungs. Other neck: Reflux of venous contrast throughout the right neck including in the right EJ and retromandibular vein to the level of the temporalis muscle. No other acute finding identified. Aortic arch: 4 vessel arch configuration, the left vertebral arises directly from the arch. Mild arch atherosclerosis. Prior CABG. Right carotid system: Negative brachiocephalic artery and right CCA. Mild soft and calcified plaque at the right carotid bifurcation without stenosis. Mildly tortuous cervical right ICA. Left carotid system: Mild plaque in the left CCA proximal to the bifurcation without stenosis. Moderate soft and calcified plaque at the left ICA origin, but no stenosis. Vertebral arteries: Mild plaque in the proximal right subclavian artery without stenosis. Right vertebral artery origin is patent. The right V1 segment is obscured by surrounding venous contrast and streak artifact, but the visible right vertebral appears patent and normal to the skull base. The right vertebral artery is dominant. Non dominant left vertebral artery arises directly from the arch. The left vertebral remains diminutive but patent to the skull base. CTA HEAD Posterior circulation: Right vertebral supplies the basilar as on the July  MRA.  Normal right PICA origin. Left vertebral functionally terminates in PICA. No right vertebral or basilar artery stenosis. Patent SCA origins. Fetal type right PCA origin again noted. Normal left PCA origin with diminutive left posterior communicating artery and stable moderate left P1/P2 stenosis (series 10, image 121). Mild to moderate right P2 and P3 segment irregularity and stenosis appears stable on series 11, image 20. Anterior circulation: Both ICA siphons are patent. Moderate calcified plaque on the left but no left siphon stenosis. Similar moderate plaque on the right with mild distal petrous stenosis only. Normal right posterior communicating artery origin. Normal ophthalmic artery origins. Patent carotid termini. Dominant left and diminutive or absent right ACA A1 segment with ectatic anterior communicating artery as before. The anterior communicating also appears mildly fenestrated. There is no discrete saccular aneurysm. ACAs appears stable since July. Left MCA M1 remains patent with stable mild to moderate irregularity and stenosis on series 10, image 17. Left MCA trifurcation and left MCA branches are patent and appear stable. Right MCA M1 segment is stable with mild irregularity. Patent right MCA trifurcation. Right MCA branches are stable and within normal limits. Venous sinuses: Early contrast timing, not well evaluated. Anatomic variants: Dominant right vertebral artery supplies the basilar. Non dominant left vertebral arises from the arch and functionally terminates in PICA. Dominant left and diminutive or absent right ACA A1 segments. Review of the MIP images confirms the above findings IMPRESSION: 1. Negative for large vessel occlusion. 2. Stable since the July MRA, with intracranial atherosclerosis and moderate stenoses of the Left MCA M1, Left PCA P1/P2. 3. Cervical Carotid atherosclerosis without significant stenosis. 4. Prior CABG. These results were communicated to Dr. Erick Blinks at  7:38 pmon 11/10/2021by text page via the [facility] messaging system. Electronically Signed   By: Odessa Fleming M.D.   On: 08/07/2020 19:38   CT ANGIO NECK CODE STROKE  Result Date: 08/07/2020 CLINICAL DATA:  54 year old male code stroke presentation. Right side weakness. EXAM: CT ANGIOGRAPHY HEAD AND NECK TECHNIQUE: Multidetector CT imaging of the head and neck was performed using the standard protocol during bolus administration of intravenous contrast. Multiplanar CT image reconstructions and MIPs were obtained to evaluate the vascular anatomy. Carotid stenosis measurements (when applicable) are obtained utilizing NASCET criteria, using the distal internal carotid diameter as the denominator. CONTRAST:  75mL OMNIPAQUE IOHEXOL 350 MG/ML SOLN COMPARISON:  Plain head CT 1858 hours today. Brain MRI and intracranial MRA 04/24/2020. FINDINGS: CTA NECK Skeleton: Prior sternotomy. Absent dentition. No acute osseous abnormality identified. Bilateral maxillary sinus disease again noted. Upper chest: Calcified coronary artery atherosclerosis. Visible central pulmonary arteries appear patent. Negative upper lungs. Other neck: Reflux of venous contrast throughout the right neck including in the right EJ and retromandibular vein to the level of the temporalis muscle. No other acute finding identified. Aortic arch: 4 vessel arch configuration, the left vertebral arises directly from the arch. Mild arch atherosclerosis. Prior CABG. Right carotid system: Negative brachiocephalic artery and right CCA. Mild soft and calcified plaque at the right carotid bifurcation without stenosis. Mildly tortuous cervical right ICA. Left carotid system: Mild plaque in the left CCA proximal to the bifurcation without stenosis. Moderate soft and calcified plaque at the left ICA origin, but no stenosis. Vertebral arteries: Mild plaque in the proximal right subclavian artery without stenosis. Right vertebral artery origin is patent. The right V1 segment is  obscured by surrounding venous contrast and streak artifact, but the visible right vertebral appears patent and normal to the  skull base. The right vertebral artery is dominant. Non dominant left vertebral artery arises directly from the arch. The left vertebral remains diminutive but patent to the skull base. CTA HEAD Posterior circulation: Right vertebral supplies the basilar as on the July MRA. Normal right PICA origin. Left vertebral functionally terminates in PICA. No right vertebral or basilar artery stenosis. Patent SCA origins. Fetal type right PCA origin again noted. Normal left PCA origin with diminutive left posterior communicating artery and stable moderate left P1/P2 stenosis (series 10, image 121). Mild to moderate right P2 and P3 segment irregularity and stenosis appears stable on series 11, image 20. Anterior circulation: Both ICA siphons are patent. Moderate calcified plaque on the left but no left siphon stenosis. Similar moderate plaque on the right with mild distal petrous stenosis only. Normal right posterior communicating artery origin. Normal ophthalmic artery origins. Patent carotid termini. Dominant left and diminutive or absent right ACA A1 segment with ectatic anterior communicating artery as before. The anterior communicating also appears mildly fenestrated. There is no discrete saccular aneurysm. ACAs appears stable since July. Left MCA M1 remains patent with stable mild to moderate irregularity and stenosis on series 10, image 17. Left MCA trifurcation and left MCA branches are patent and appear stable. Right MCA M1 segment is stable with mild irregularity. Patent right MCA trifurcation. Right MCA branches are stable and within normal limits. Venous sinuses: Early contrast timing, not well evaluated. Anatomic variants: Dominant right vertebral artery supplies the basilar. Non dominant left vertebral arises from the arch and functionally terminates in PICA. Dominant left and diminutive  or absent right ACA A1 segments. Review of the MIP images confirms the above findings IMPRESSION: 1. Negative for large vessel occlusion. 2. Stable since the July MRA, with intracranial atherosclerosis and moderate stenoses of the Left MCA M1, Left PCA P1/P2. 3. Cervical Carotid atherosclerosis without significant stenosis. 4. Prior CABG. These results were communicated to Dr. Erick Blinks at 7:38 pmon 11/10/2021by text page via the [facility] messaging system. Electronically Signed   By: Odessa Fleming M.D.   On: 08/07/2020 19:38       Subjective: Patient seen and examined at bedside.  Awake but a very poor historian and does not participate in conversation much.  Nursing staff reports intermittent agitation yesterday.  No overnight fever or vomiting reported.  Discharge Exam: Vitals:   08/15/20 0433 08/15/20 0750  BP: (!) 145/77 (!) 143/82  Pulse: 66 66  Resp: 18 16  Temp: 98.5 F (36.9 C) 97.7 F (36.5 C)  SpO2: 99% 99%    General: Pt is awake, poor historian.  Right eye injection noted. Cardiovascular: rate controlled, S1/S2 + Respiratory: bilateral decreased breath sounds at bases  abdominal: Soft, NT, ND, bowel sounds + Extremities: no edema, no cyanosis    The results of significant diagnostics from this hospitalization (including imaging, microbiology, ancillary and laboratory) are listed below for reference.     Microbiology: Recent Results (from the past 240 hour(s))  Resp Panel by RT PCR (RSV, Flu A&B, Covid) - Nasopharyngeal Swab     Status: None   Collection Time: 08/07/20  7:43 PM   Specimen: Nasopharyngeal Swab  Result Value Ref Range Status   SARS Coronavirus 2 by RT PCR NEGATIVE NEGATIVE Final    Comment: (NOTE) SARS-CoV-2 target nucleic acids are NOT DETECTED.  The SARS-CoV-2 RNA is generally detectable in upper respiratoy specimens during the acute phase of infection. The lowest concentration of SARS-CoV-2 viral copies this assay can detect is  131 copies/mL. A  negative result does not preclude SARS-Cov-2 infection and should not be used as the sole basis for treatment or other patient management decisions. A negative result may occur with  improper specimen collection/handling, submission of specimen other than nasopharyngeal swab, presence of viral mutation(s) within the areas targeted by this assay, and inadequate number of viral copies (<131 copies/mL). A negative result must be combined with clinical observations, patient history, and epidemiological information. The expected result is Negative.  Fact Sheet for Patients:  https://www.moore.com/  Fact Sheet for Healthcare Providers:  https://www.young.biz/  This test is no t yet approved or cleared by the [place] FDA and  has been authorized for detection and/or diagnosis of SARS-CoV-2 by FDA under an Emergency Use Authorization (EUA). This EUA will remain  in effect (meaning this test can be used) for the duration of the COVID-19 declaration under Section 564(b)(1) of the Act, 21 U.S.C. section 360bbb-3(b)(1), unless the authorization is terminated or revoked sooner.     Influenza A by PCR NEGATIVE NEGATIVE Final   Influenza B by PCR NEGATIVE NEGATIVE Final    Comment: (NOTE) The Xpert Xpress SARS-CoV-2/FLU/RSV assay is intended as an aid in  the diagnosis of influenza from Nasopharyngeal swab specimens and  should not be used as a sole basis for treatment. Nasal washings and  aspirates are unacceptable for Xpert Xpress SARS-CoV-2/FLU/RSV  testing.  Fact Sheet for Patients: https://www.moore.com/  Fact Sheet for Healthcare Providers: https://www.young.biz/  This test is not yet approved or cleared by the [place] FDA and  has been authorized for detection and/or diagnosis of SARS-CoV-2 by  FDA under an Emergency Use Authorization (EUA). This EUA will remain  in effect (meaning this test can  be used) for the duration of the  Covid-19 declaration under Section 564(b)(1) of the Act, 21  U.S.C. section 360bbb-3(b)(1), unless the authorization is  terminated or revoked.    Respiratory Syncytial Virus by PCR NEGATIVE NEGATIVE Final    Comment: (NOTE) Fact Sheet for Patients: https://www.moore.com/  Fact Sheet for Healthcare Providers: https://www.young.biz/  This test is not yet approved or cleared by the [place] FDA and  has been authorized for detection and/or diagnosis of SARS-CoV-2 by  FDA under an Emergency Use Authorization (EUA). This EUA will remain  in effect (meaning this test can be used) for the duration of the  COVID-19 declaration under Section 564(b)(1) of the Act, 21 U.S.C.  section 360bbb-3(b)(1), unless the authorization is terminated or  revoked. Performed at [facility], [address] [place], [place] [address]      Labs: BNP (last 3 results) No results for input(s): BNP in the last 8760 hours. Basic Metabolic Panel: Recent Labs  Lab 08/12/20 1038 08/15/20 0315  NA 130* 134*  K 4.3 4.4  CL 96* 99  CO2 25 27  GLUCOSE 170* 233*  BUN 16 26*  CREATININE 1.02 1.35*  CALCIUM 9.0 9.0  MG  --  1.5*   Liver Function Tests: No results for input(s): AST, ALT, ALKPHOS, BILITOT, PROT, ALBUMIN in the last 168 hours. No results for input(s): LIPASE, AMYLASE in the last 168 hours. No results for input(s): AMMONIA in the last 168 hours. CBC: Recent Labs  Lab 08/12/20 1038 08/15/20 0315  WBC 12.1* 7.8  NEUTROABS  --  4.4  HGB 15.2 13.3  HCT 44.5 39.8  MCV 84.9 85.8  PLT 158 175   Cardiac Enzymes: No results for input(s): CKTOTAL, CKMB, CKMBINDEX, TROPONINI in  the last 168 hours. BNP: Invalid input(s): POCBNP CBG: Recent Labs  Lab 08/14/20 1155 08/14/20 1514 08/14/20 1659 08/14/20 2142 08/15/20 0641  GLUCAP 195* 244* 227* 253* 258*   D-Dimer No results for input(s): DDIMER in the  last 72 hours. Hgb A1c No results for input(s): HGBA1C in the last 72 hours. Lipid Profile No results for input(s): CHOL, HDL, LDLCALC, TRIG, CHOLHDL, LDLDIRECT in the last 72 hours. Thyroid function studies No results for input(s): TSH, T4TOTAL, T3FREE, THYROIDAB in the last 72 hours.  Invalid input(s): FREET3 Anemia work up No results for input(s): VITAMINB12, FOLATE, FERRITIN, TIBC, IRON, RETICCTPCT in the last 72 hours. Urinalysis    Component Value Date/Time   COLORURINE YELLOW 08/08/2020 0358   APPEARANCEUR CLEAR 08/08/2020 0358   LABSPEC 1.044 (H) 08/08/2020 0358   PHURINE 5.0 08/08/2020 0358   GLUCOSEU >=500 (A) 08/08/2020 0358   HGBUR SMALL (A) 08/08/2020 0358   BILIRUBINUR NEGATIVE 08/08/2020 0358   KETONESUR NEGATIVE 08/08/2020 0358   PROTEINUR >=300 (A) 08/08/2020 0358   NITRITE NEGATIVE 08/08/2020 0358   LEUKOCYTESUR NEGATIVE 08/08/2020 0358   Sepsis Labs Invalid input(s): PROCALCITONIN,  WBC,  LACTICIDVEN Microbiology Recent Results (from the past 240 hour(s))  Resp Panel by RT PCR (RSV, Flu A&B, Covid) - Nasopharyngeal Swab     Status: None   Collection Time: 08/07/20  7:43 PM   Specimen: Nasopharyngeal Swab  Result Value Ref Range Status   SARS Coronavirus 2 by RT PCR NEGATIVE NEGATIVE Final    Comment: (NOTE) SARS-CoV-2 target nucleic acids are NOT DETECTED.  The SARS-CoV-2 RNA is generally detectable in upper respiratoy specimens during the acute phase of infection. The lowest concentration of SARS-CoV-2 viral copies this assay can detect is 131 copies/mL. A negative result does not preclude SARS-Cov-2 infection and should not be used as the sole basis for treatment or other patient management decisions. A negative result may occur with  improper specimen collection/handling, submission of specimen other than nasopharyngeal swab, presence of viral mutation(s) within the areas targeted by this assay, and inadequate number of viral copies (<131  copies/mL). A negative result must be combined with clinical observations, patient history, and epidemiological information. The expected result is Negative.  Fact Sheet for Patients:  https://www.moore.com/  Fact Sheet for Healthcare Providers:  https://www.young.biz/  This test is no t yet approved or cleared by the [place] FDA and  has been authorized for detection and/or diagnosis of SARS-CoV-2 by FDA under an Emergency Use Authorization (EUA). This EUA will remain  in effect (meaning this test can be used) for the duration of the COVID-19 declaration under Section 564(b)(1) of the Act, 21 U.S.C. section 360bbb-3(b)(1), unless the authorization is terminated or revoked sooner.     Influenza A by PCR NEGATIVE NEGATIVE Final   Influenza B by PCR NEGATIVE NEGATIVE Final    Comment: (NOTE) The Xpert Xpress SARS-CoV-2/FLU/RSV assay is intended as an aid in  the diagnosis of influenza from Nasopharyngeal swab specimens and  should not be used as a sole basis for treatment. Nasal washings and  aspirates are unacceptable for Xpert Xpress SARS-CoV-2/FLU/RSV  testing.  Fact Sheet for Patients: https://www.moore.com/  Fact Sheet for Healthcare Providers: https://www.young.biz/  This test is not yet approved or cleared by the [place] FDA and  has been authorized for detection and/or diagnosis of SARS-CoV-2 by  FDA under an Emergency Use Authorization (EUA). This EUA will remain  in effect (meaning this test can be used) for the duration of  the  Covid-19 declaration under Section 564(b)(1) of the Act, 21  U.S.C. section 360bbb-3(b)(1), unless the authorization is  terminated or revoked.    Respiratory Syncytial Virus by PCR NEGATIVE NEGATIVE Final    Comment: (NOTE) Fact Sheet for Patients: https://www.moore.com/https://www.fda.gov/media/142436/download  Fact Sheet for Healthcare  Providers: https://www.young.biz/https://www.fda.gov/media/142435/download  This test is not yet approved or cleared by the [place] FDA and  has been authorized for detection and/or diagnosis of SARS-CoV-2 by  FDA under an Emergency Use Authorization (EUA). This EUA will remain  in effect (meaning this test can be used) for the duration of the  COVID-19 declaration under Section 564(b)(1) of the Act, 21 U.S.C.  section 360bbb-3(b)(1), unless the authorization is terminated or  revoked. Performed at [facility], [address] [place], [place] [address]      Time coordinating discharge: 35 minutes  SIGNED:   Glade LloydKshitiz Gerard Bonus, MD  [facility] 08/15/2020, 10:57 AM '

## 2020-08-15 NOTE — Progress Notes (Signed)
Physical Therapy Treatment Patient Details Name: Jeremy Lang MRN: [id number] DOB: 12-12-1965 Today's Date: 08/15/2020    History of Present Illness Pt is a 54 year old male who presented with impaired speech, R facial droop, and R sided weakness. He also displayed a seizure like episode. CT and MRI were negative for any acute abnormalities. NIHSS of 5-6. PMH significant for DM2, CAD, s/p CABG, seizures, prior hemorrhagic lacunar infarct, and schizoaffective disorder.    PT Comments    Pt demonstrates spontaneous moments of almost normal gait pattern in his R leg. This occurred 1x after pt got up and walked to his bed from the bathroom with the RW, in which he displayed more neutral R leg rotation, improved R foot clearance and ankle dorsiflexion AROM during swing, and increased R step length. However, initially during the session he displayed significant R foot drag which would become more stiff when pt was provided tactile cues to correct. Foot clearance improved slightly when the pt was pushed to increase his gait speed. He demonstrated poor negotiation of his RW as he consistently held the RW crooked and distal to his body, despite max cues to correct. Pt also displayed spontaneous bouts of LOB or posterior lean during standing or gait bouts, resulting in him requiring minA to recover. Will continue to follow acutely. Current recommendations remain appropriate.   SNF;Supervision for mobility/OOB     Equipment Recommendations  Rolling walker with 5" wheels;3in1 (PT);Wheelchair (measurements PT);Wheelchair cushion (measurements PT)    Recommendations for Other Services       Precautions / Restrictions Precautions Precautions: Fall Precaution Comments: seizure like movements - EEG negative, R weakness/R foot drag Restrictions Weight Bearing Restrictions: No    Mobility  Bed Mobility Overal bed mobility: Modified Independent Bed Mobility: Supine to Sit     Supine to sit: HOB  elevated;Min guard     General bed mobility comments: Pt able to come to sit with extra time and use of bed rails.  Transfers Overall transfer level: Needs assistance Equipment used: Rolling walker (2 wheeled) Transfers: Sit to/from Stand Sit to Stand: Min assist         General transfer comment: Cues provided for hand placement, requiring minA to power up to stand. Pt reports lower back pain with transfers.  Ambulation/Gait Ambulation/Gait assistance: Min assist Gait Distance (Feet): 700 Feet Assistive device: Rolling walker (2 wheeled) Gait Pattern/deviations: Step-through pattern;Decreased step length - right;Decreased stride length;Decreased dorsiflexion - right Gait velocity: decreased pt selected speed, but able to increase to WNL Gait velocity interpretation: >4.37 ft/sec, indicative of normal walking speed (when RW pushed by therapist) General Gait Details: Inconsistent findings with R leg gait pattern while ambulating. Initially, pt is stiff through R knee and drags R foot during swing even when provided verbal, visual, and tactile cues to allow knee to flex and to exaggerate hip flexion with swing. Noted resistance and increased stiffness in R knee when providing physical assistance to knee. When RW was pushed by therapist with increased speed anteriorly he would demonstrate slightly improved R ankle dorsiflexion activation, improved R knee flexion, and slightly increased R foot clearance. Cotninual cues provided to maintain within RW and keep RW straight. When pt ambulated from bathroom after having bowel movement he demonstrated significantly improved R leg neutral position rather than excessive external rotation and demonstrated R foot clearance and more normal gait pattern.    Stairs             Psychologist, prison and probation services  Modified Rankin (Stroke Patients Only) Modified Rankin (Stroke Patients Only) Pre-Morbid Rankin Score: No symptoms Modified Rankin: Moderately  severe disability     Balance Overall balance assessment: Needs assistance Sitting-balance support: No upper extremity supported;Feet supported Sitting balance-Leahy Scale: Good Sitting balance - Comments: No LOB sitting EOB with supervision for safety.   Standing balance support: No upper extremity supported;During functional activity Standing balance-Leahy Scale: Fair Standing balance comment: Able to reach off BOS minimally without UE support but benefits from UE support on RW for stability.                            Cognition Arousal/Alertness: Awake/alert Behavior During Therapy: Impulsive Overall Cognitive Status: No family/caregiver present to determine baseline cognitive functioning Area of Impairment: Safety/judgement;Awareness;Following commands;Attention                   Current Attention Level: Sustained     Safety/Judgement: Decreased awareness of safety;Decreased awareness of deficits Awareness: Emergent Problem Solving: Slow processing;Decreased initiation;Requires verbal cues;Requires tactile cues General Comments: Pt easily distractable. Pt impulsive on occasion with spontaneous bouts of posterior lean or LOB. Required continual cues to maintain proximity to RW and to clear R foot during gait.      Exercises      General Comments        Pertinent Vitals/Pain Pain Assessment: Faces Faces Pain Scale: Hurts little more Pain Location: generalized, lower back and legs with transfers, R eye Pain Descriptors / Indicators: Discomfort;Grimacing;Sore Pain Intervention(s): Limited activity within patient's tolerance;Monitored during session;Repositioned;Other (comment) (pt requesting eye drops, RN notified)    Home Living                      Prior Function            PT Goals (current goals can now be found in the care plan section) Acute Rehab PT Goals Patient Stated Goal: to get his eye drops PT Goal Formulation: With  patient Time For Goal Achievement: 08/22/20 Potential to Achieve Goals: Good Progress towards PT goals: Progressing toward goals    Frequency    Min 3X/week      PT Plan Current plan remains appropriate    Co-evaluation              AM-PAC PT "6 Clicks" Mobility   Outcome Measure  Help needed turning from your back to your side while in a flat bed without using bedrails?: A Little Help needed moving from lying on your back to sitting on the side of a flat bed without using bedrails?: A Little Help needed moving to and from a bed to a chair (including a wheelchair)?: A Little Help needed standing up from a chair using your arms (e.g., wheelchair or bedside chair)?: A Little Help needed to walk in hospital room?: A Little Help needed climbing 3-5 steps with a railing? : A Little 6 Click Score: 18    End of Session Equipment Utilized During Treatment: Gait belt Activity Tolerance: Patient tolerated treatment well Patient left: in bed;with call bell/phone within reach;with bed alarm set;with nursing/sitter in room Nurse Communication: Mobility status;Other (comment) (request for eye drops) PT Visit Diagnosis: Unsteadiness on feet (R26.81);Other abnormalities of gait and mobility (R26.89);Muscle weakness (generalized) (M62.81);Difficulty in walking, not elsewhere classified (R26.2);Other symptoms and signs involving the nervous system (R29.898)     Time: 6606-3016 PT Time Calculation (min) (ACUTE ONLY): 36 min  Charges:  $  Gait Training: 23-37 mins                     Raymond Gurney, PT, DPT Acute Rehabilitation Services  Pager: [phone] Office: [phone]    Jewel Baize 08/15/2020, 11:20 AM

## 2020-08-15 NOTE — Progress Notes (Signed)
PT has no IV at this time- MD aware. If pt less anxious on days, can reattempt per MD. If pt refusing stick OK to leave out if no IV meds due.

## 2020-08-15 NOTE — TOC Progression Note (Signed)
Transition of Care [facility]) - Progression Note    Patient Details  Name: Tracen Mahler MRN: [id number] Date of Birth: 07/07/1966  [facility] CM/SW Contact  Baldemar Lenis, [place] Phone Number: 08/15/2020, 4:32 PM  Clinical Narrative:   CSW received update from [place] that authorization has been received, but they cannot admit today as their admissions nurse has left for the day. Patient can admit first thing tomorrow morning. CSW spoke with patient to update him, and discussed with him a letter from social security that he was concerned about. CSW unable to assist, talked to him about reaching out to social security again for assistance about what the letter means. CSW to follow for discharge to SNF tomorrow.    Expected Discharge Plan: Skilled Nursing Facility Barriers to Discharge: Insurance Authorization  Expected Discharge Plan and Services Expected Discharge Plan: Skilled Nursing Facility     Post Acute Care Choice: Skilled Nursing Facility Living arrangements for the past 2 months: Apartment Expected Discharge Date: 08/15/20                                     Social Determinants of Health (SDOH) Interventions    Readmission Risk Interventions No flowsheet data found.

## 2020-08-15 NOTE — Progress Notes (Signed)
Pt called RN to room complaining of R eye pain. Upon assessment eye is red but pupil is reactive. RN flushed eye with normal saline flush but pt stated no relief. RN did not seen anything in eye when inspecting with light, MD paged.

## 2020-08-16 LAB — GLUCOSE, CAPILLARY
Glucose-Capillary: 202 mg/dL — ABNORMAL HIGH (ref 70–99)
Glucose-Capillary: 191 mg/dL — ABNORMAL HIGH (ref 70–99)

## 2020-08-16 NOTE — Progress Notes (Signed)
Physical Therapy Treatment Patient Details Name: Jeremy Lang MRN: [id number] DOB: March 12, 1966 Today's Date: 08/16/2020    History of Present Illness Pt is a 54 year old male who presented with impaired speech, R facial droop, and R sided weakness. He also displayed a seizure like episode. CT and MRI were negative for any acute abnormalities. NIHSS of 5-6. PMH significant for DM2, CAD, s/p CABG, seizures, prior hemorrhagic lacunar infarct, and schizoaffective disorder.    PT Comments    Focused today's session on increasing bilat leg muscular strength and on increasing independence with bed mobility and transfers. Pt reported diarrhea and requested to remain in room this date, thus held off on gait training. Performed sit to stands from EOB, toe raises in sitting, bridges in bed, hip adduction pillow squeezes in bed, and standing marching to improve muscle contraction and strengthening bilaterally, especially in his R leg. Pt moaned throughout session with all attempted tasks/mobility. The pt was encouraged to perform bed mobility with more independence and thus cued pt that therapist would not assist except to provide verbal cues. Pt demonstrated extreme difficulty with returning to supine due to poor R hip flexion to rise leg onto bed. When he would get the R leg on the bed it would eventually slide back off, x3 reps, even after lying on the bed for bouts of >1 minute before the leg slid off the bed. Noted inconsistencies with ability to lift R leg with sit to supine transitions and AROM standing marching. Will continue to follow acutely. Current recommendations remain appropriate.   Follow Up Recommendations  SNF;Supervision for mobility/OOB     Equipment Recommendations  Rolling walker with 5" wheels;3in1 (PT);Wheelchair (measurements PT);Wheelchair cushion (measurements PT)    Recommendations for Other Services       Precautions / Restrictions Precautions Precautions:  Fall Precaution Comments: seizure like movements - EEG negative, R weakness/R foot drag Restrictions Weight Bearing Restrictions: No    Mobility  Bed Mobility Overal bed mobility: Needs Assistance Bed Mobility: Supine to Sit;Sit to Supine     Supine to sit: HOB elevated;Min guard Sit to supine: HOB elevated;Min guard   General bed mobility comments: Pt able to come to sit with extra time and use of bed rails. Significantly increased time required to return to supine due to poor activation to lift R leg, thus cued pt to assist though trunk rotation by partially rolling to L. Pt displayed improved R hip flexion when therapist reported would not assist as to encourage increased pt independence with mobility, x3 bouts as pt let R leg slide off bed after becoming fully supine/sidelying.  Transfers Overall transfer level: Needs assistance Equipment used: Rolling walker (2 wheeled) Transfers: Sit to/from Stand Sit to Stand: Min assist         General transfer comment: Cues provided for hand placement, requiring minA to power up to stand. Cues provided to reach back for sitting surface prior to returning to sit for safety, min carryover noted.  Ambulation/Gait             General Gait Details: Deferred due to focusing session on strengthening legs and pt requested to remain in room due to bowel issues this date   Stairs             Wheelchair Mobility    Modified Rankin (Stroke Patients Only) Modified Rankin (Stroke Patients Only) Pre-Morbid Rankin Score: No symptoms Modified Rankin: Moderately severe disability     Balance Overall balance assessment: Needs assistance Sitting-balance  support: No upper extremity supported;Feet supported Sitting balance-Leahy Scale: Good Sitting balance - Comments: No LOB sitting EOB with supervision for safety.   Standing balance support: No upper extremity supported Standing balance-Leahy Scale: Fair Standing balance comment:  Able to stand statically without UE support with mild trunk sway and min guard assist.                            Cognition Arousal/Alertness: Awake/alert Behavior During Therapy: WFL for tasks assessed/performed Overall Cognitive Status: No family/caregiver present to determine baseline cognitive functioning Area of Impairment: Safety/judgement;Awareness;Following commands;Attention                   Current Attention Level: Sustained   Following Commands: Follows one step commands consistently;Follows one step commands with increased time;Follows multi-step commands inconsistently Safety/Judgement: Decreased awareness of safety;Decreased awareness of deficits Awareness: Emergent Problem Solving: Slow processing;Decreased initiation;Requires verbal cues;Requires tactile cues General Comments: Pt easily distractable. Pt moaning throughout session with all tasks. Pt requires continual encouragement that he has strength in his R leg to perform tasks with improved quality.      Exercises General Exercises - Lower Extremity Gluteal Sets: Strengthening;Both;5 reps;Supine (bridges) Hip ABduction/ADduction: Strengthening;Both;5 reps;Supine (pillow squeezes) Hip Flexion/Marching: Strengthening;Both;5 reps;Standing (with use of RW) Toe Raises: AROM;Strengthening;Both;10 reps;Seated Mini-Sqauts: Strengthening;Both;5 reps (Sit to stand from EOB)    General Comments        Pertinent Vitals/Pain Pain Assessment: Faces Faces Pain Scale: Hurts whole lot Pain Location: "in my bowels", generalized with all tasks Pain Descriptors / Indicators: Discomfort;Grimacing;Sore;Moaning Pain Intervention(s): Limited activity within patient's tolerance;Monitored during session;Repositioned    Home Living                      Prior Function            PT Goals (current goals can now be found in the care plan section) Acute Rehab PT Goals Patient Stated Goal: to be a normal  human being PT Goal Formulation: With patient Time For Goal Achievement: 08/22/20 Potential to Achieve Goals: Good Progress towards PT goals: Progressing toward goals    Frequency    Min 3X/week      PT Plan Current plan remains appropriate    Co-evaluation              AM-PAC PT "6 Clicks" Mobility   Outcome Measure  Help needed turning from your back to your side while in a flat bed without using bedrails?: A Little Help needed moving from lying on your back to sitting on the side of a flat bed without using bedrails?: A Little Help needed moving to and from a bed to a chair (including a wheelchair)?: A Little Help needed standing up from a chair using your arms (e.g., wheelchair or bedside chair)?: A Little Help needed to walk in hospital room?: A Little Help needed climbing 3-5 steps with a railing? : A Little 6 Click Score: 18    End of Session Equipment Utilized During Treatment: Gait belt Activity Tolerance: Patient tolerated treatment well Patient left: in bed;with call bell/phone within reach;with bed alarm set Nurse Communication: Mobility status PT Visit Diagnosis: Unsteadiness on feet (R26.81);Other abnormalities of gait and mobility (R26.89);Muscle weakness (generalized) (M62.81);Difficulty in walking, not elsewhere classified (R26.2);Other symptoms and signs involving the nervous system (U72.536)     Time: 6440-3474 PT Time Calculation (min) (ACUTE ONLY): 35 min  Charges:  $Therapeutic Exercise: 8-22  mins $Therapeutic Activity: 8-22 mins                     Raymond Gurney, PT, DPT Acute Rehabilitation Services  Pager: [phone] Office: [phone]    Jewel Baize 08/16/2020, 8:46 AM

## 2020-08-16 NOTE — Progress Notes (Signed)
Attempted report to Gay x 1

## 2020-08-16 NOTE — TOC Transition Note (Signed)
Transition of Care [facility]) - CM/SW Discharge Note   Patient Details  Name: Jeremy Lang MRN: [id number] Date of Birth: 1965/11/12  [facility] CM/SW Contact:  Baldemar Lenis, LCSW Phone Number: 08/16/2020, 10:33 AM   Clinical Narrative:   Nurse to call report to [phone], Ask for [facility] nurse    Final next level of care: Skilled Nursing Facility Barriers to Discharge: Barriers Resolved   Patient Goals and CMS Choice Patient states their goals for this hospitalization and ongoing recovery are:: to get rehab CMS Medicare.gov Compare Post Acute Care list provided to:: Patient Choice offered to / list presented to : Patient  Discharge Placement              Patient chooses bed at: [facility] Patient to be transferred to facility by: [facility] Name of family member notified: Self Patient and family notified of of transfer: 08/16/20  Discharge Plan and Services     Post Acute Care Choice: Skilled Nursing Facility                               Social Determinants of Health (SDOH) Interventions     Readmission Risk Interventions No flowsheet data found.

## 2020-08-16 NOTE — Plan of Care (Signed)
Adequate for discharged

## 2020-08-16 NOTE — Progress Notes (Signed)
Patient ID: Jeremy Lang, male   DOB: [birth year], 54 y.o.   MRN: [id number] Patient is waiting to be discharged to SNF.  Patient seen and examined at bedside.  He is currently medically stable for discharge.  Please refer to the full discharge summary done by me on 08/15/2020 for full details.  He will be discharged to SNF once bed is available.

## 2021-09-23 IMAGING — CR DG CHEST 2V
2 series · 2 of 2 positions shown · non-contrast
Comparison: None.

CLINICAL DATA: Chest pain

EXAM:
CHEST - 2 VIEW

[w chest lat]
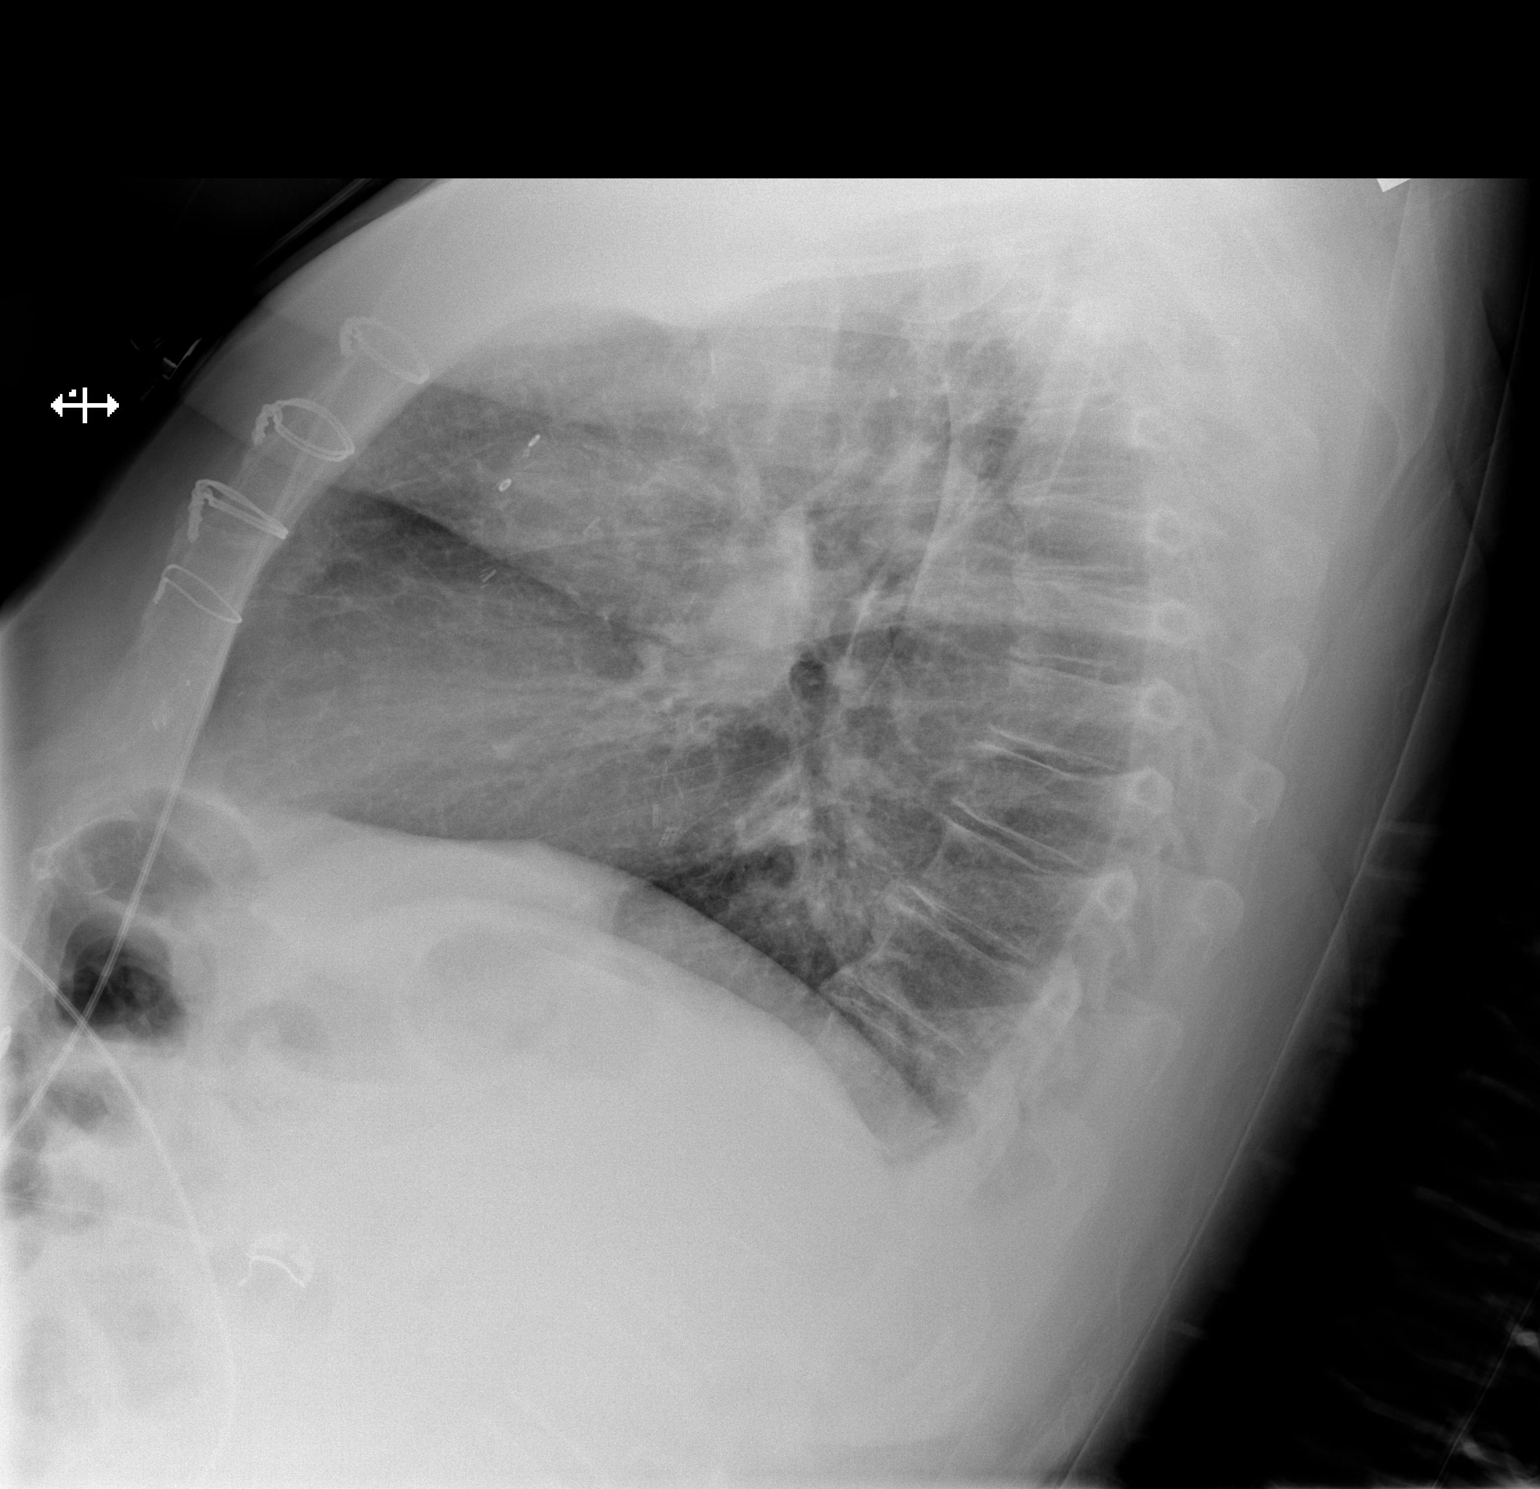

[x chest ap]
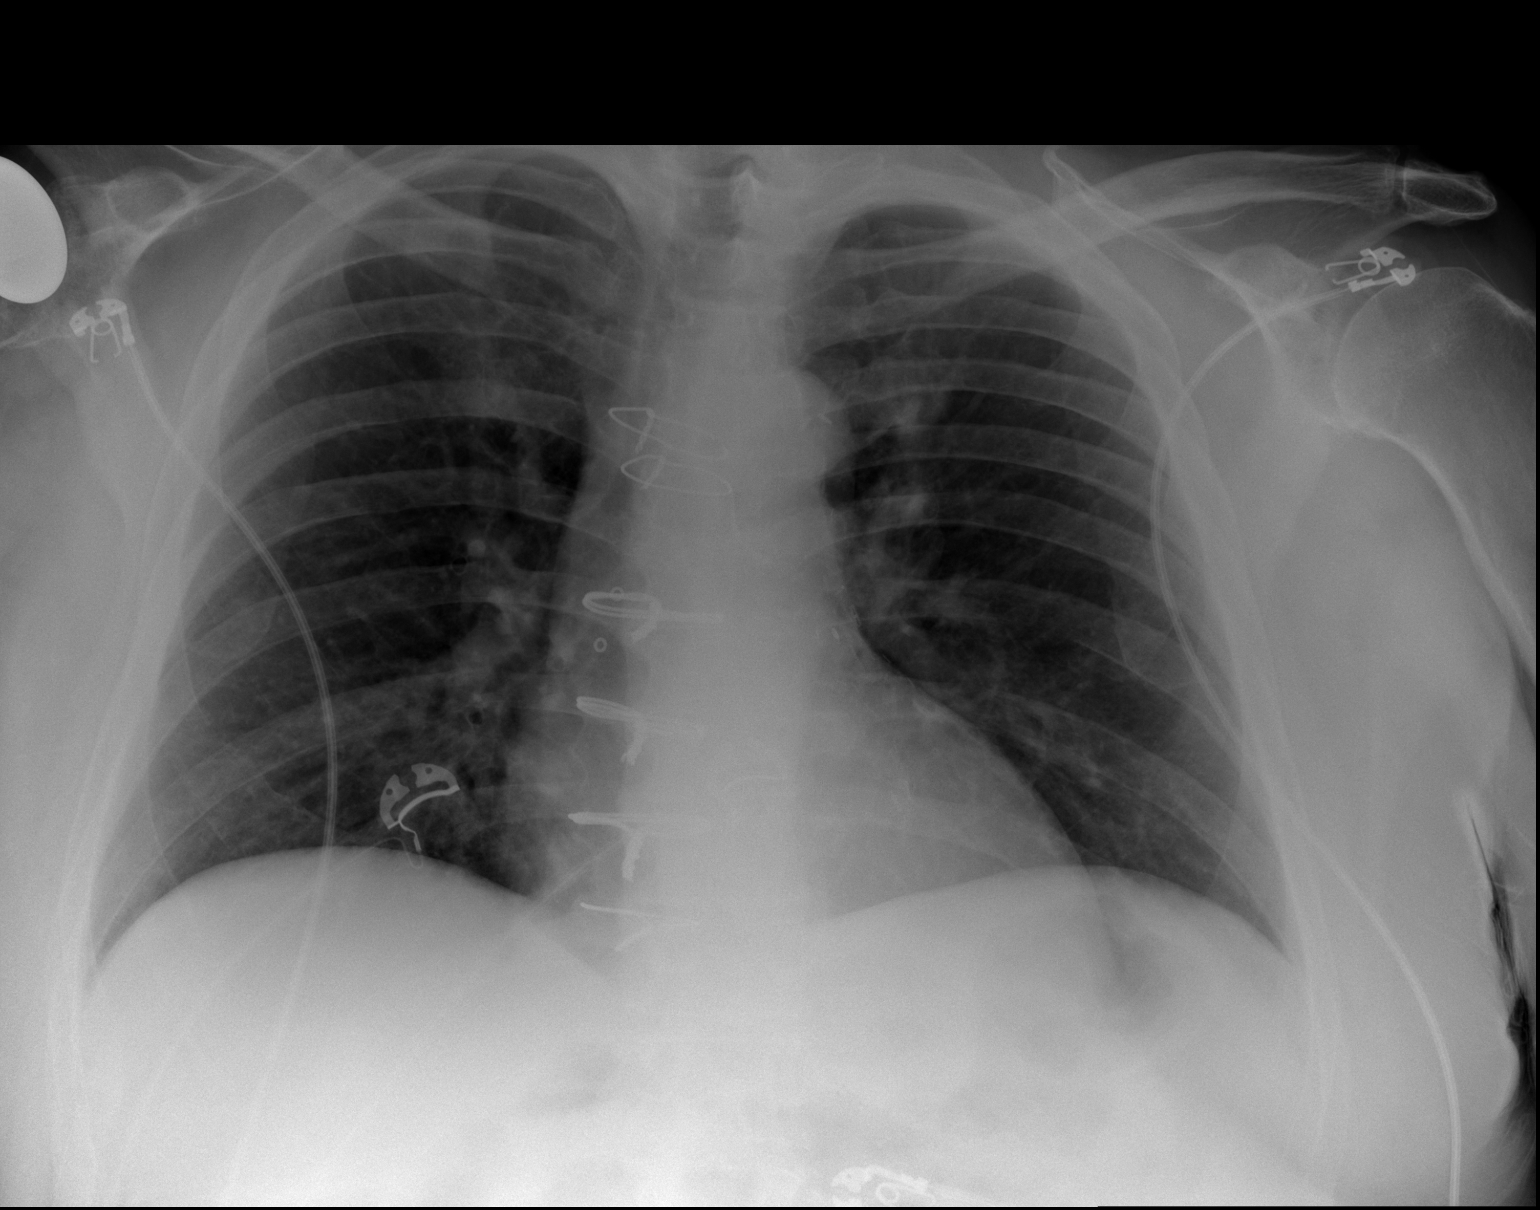

[2 of 2 positions shown; findings below may reference images not displayed]

FINDINGS: Lungs are clear. No pneumothorax or pleural effusion. Coronary
artery bypass grafting has been performed. Cardiac size within
normal limits. Pulmonary vascularity is normal. No acute bone
abnormality.
IMPRESSION: No active cardiopulmonary disease.

## 2021-10-16 IMAGING — DX DG HIP (WITH OR WITHOUT PELVIS) 2-3V*R*
3 series · 3 of 3 positions shown · non-contrast
Comparison: None.

CLINICAL DATA: Hip pain

EXAM:
DG HIP (WITH OR WITHOUT PELVIS) 2-3V RIGHT

[pelvis ap]
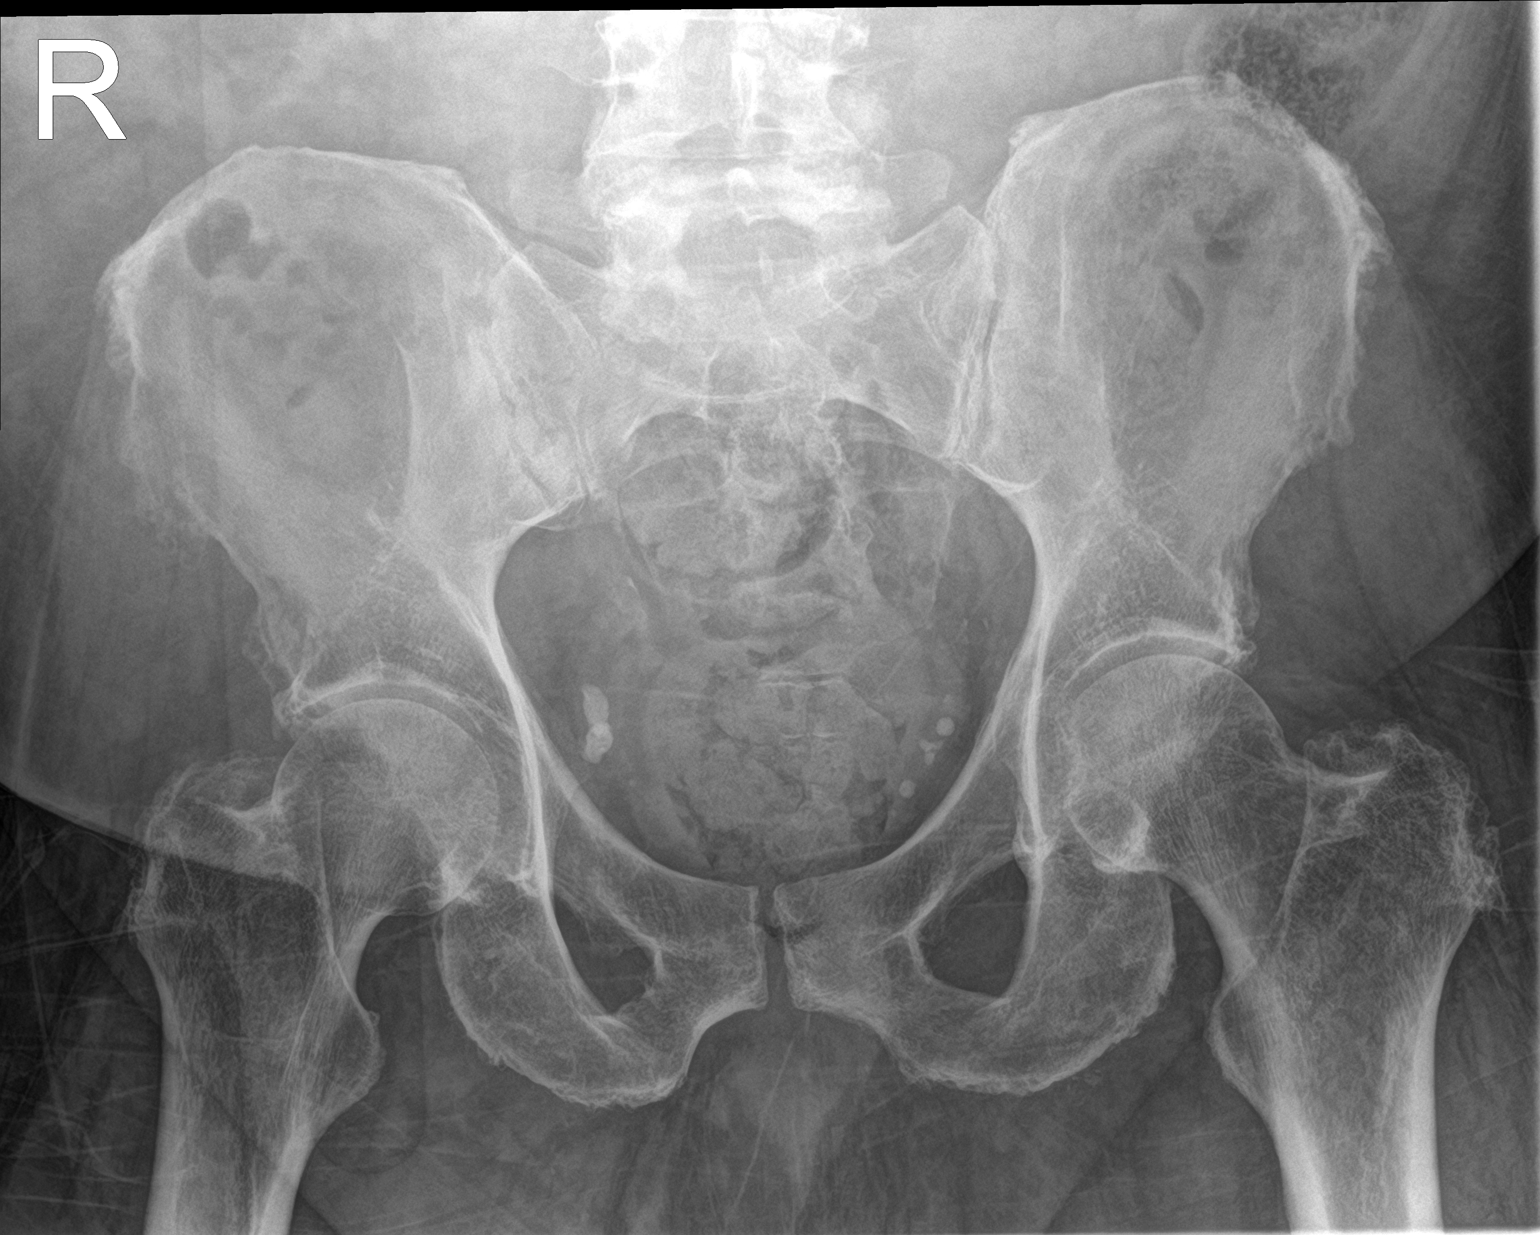

[hip ap]
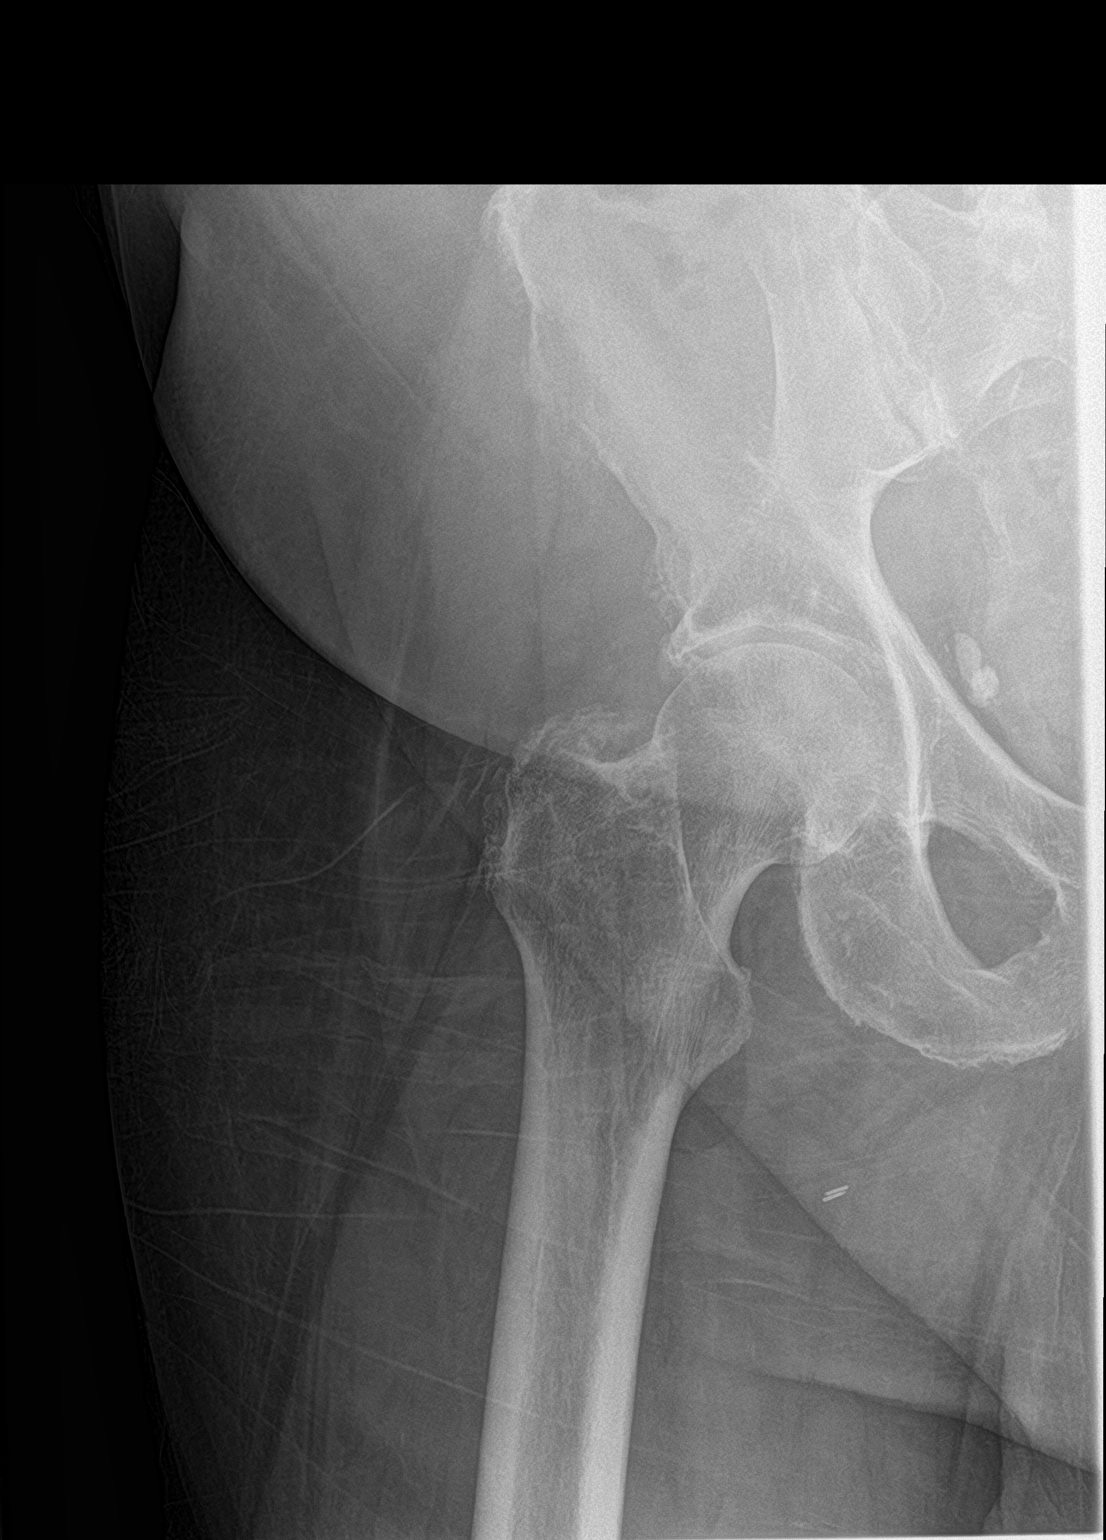

[hip lat]
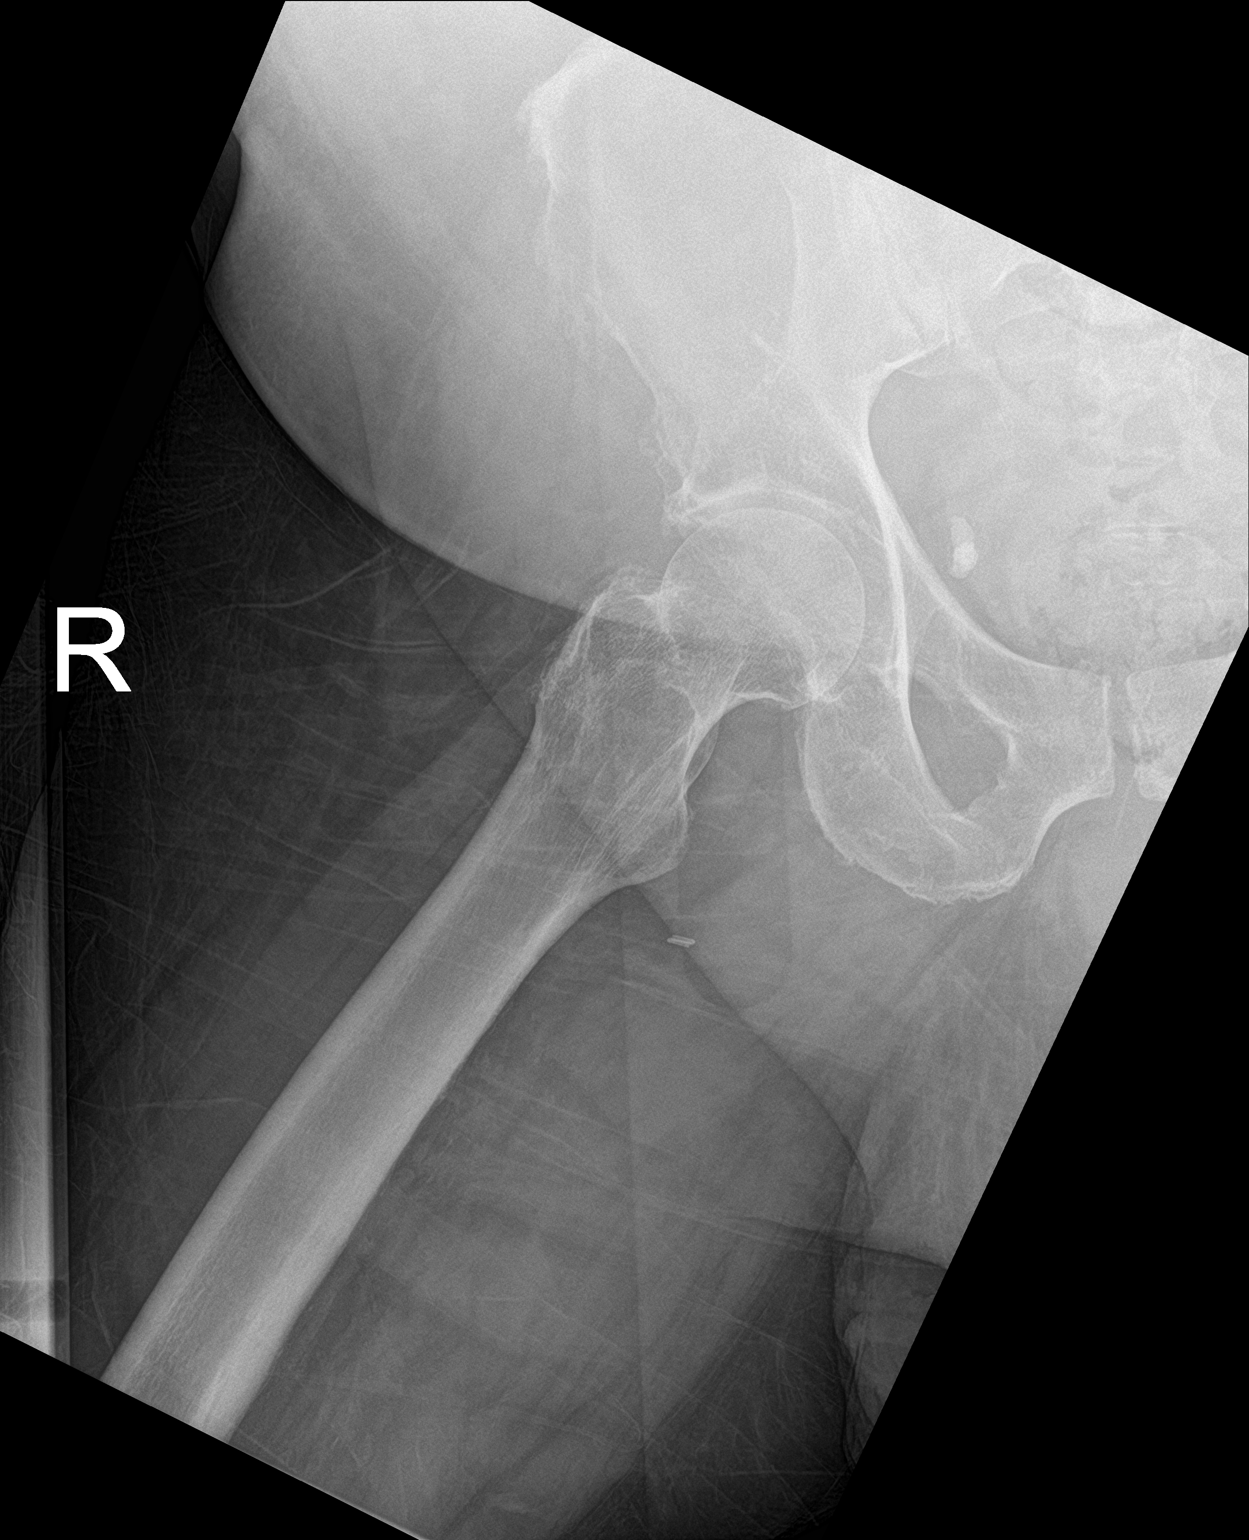

[3 of 3 positions shown; findings below may reference images not displayed]

FINDINGS: There is mild bilateral hip osteoarthritis. There is no acute
displaced fracture or dislocation. There are multiple calcifications
projecting over the patient's pelvis that are favored to represent
phleboliths.
IMPRESSION: Mild bilateral hip osteoarthritis. No acute displaced fracture or
dislocation.
